# Patient Record
Sex: Female | Born: 1935 | Race: White | Hispanic: No | Smoking: Former smoker
Health system: Southern US, Community
[De-identification: ages and names within clinical notes are randomized; demographics above are authoritative.]

## PROBLEM LIST (undated history)

## (undated) DIAGNOSIS — I10 Essential (primary) hypertension: Secondary | ICD-10-CM

## (undated) HISTORY — DX: Essential (primary) hypertension: I10

## (undated) HISTORY — PX: HERNIA REPAIR: SHX51

## (undated) HISTORY — PX: APPENDECTOMY: SHX54

## (undated) HISTORY — PX: BREAST BIOPSY: SHX20

## (undated) HISTORY — PX: REPLACEMENT TOTAL KNEE BILATERAL: SUR1225

---

## 1997-11-21 ENCOUNTER — Emergency Department (HOSPITAL_COMMUNITY): Admission: EM | Admit: 1997-11-21 | Discharge: 1997-11-21 | Payer: Self-pay | Admitting: Emergency Medicine

## 1998-02-18 ENCOUNTER — Other Ambulatory Visit: Admission: RE | Admit: 1998-02-18 | Discharge: 1998-02-18 | Payer: Self-pay | Admitting: Obstetrics and Gynecology

## 1999-02-19 ENCOUNTER — Emergency Department (HOSPITAL_COMMUNITY): Admission: EM | Admit: 1999-02-19 | Discharge: 1999-02-19 | Payer: Self-pay | Admitting: Emergency Medicine

## 1999-04-18 ENCOUNTER — Other Ambulatory Visit: Admission: RE | Admit: 1999-04-18 | Discharge: 1999-04-18 | Payer: Self-pay | Admitting: Obstetrics and Gynecology

## 2000-01-11 ENCOUNTER — Encounter: Admission: RE | Admit: 2000-01-11 | Discharge: 2000-01-11 | Payer: Self-pay | Admitting: Internal Medicine

## 2000-01-11 ENCOUNTER — Encounter: Payer: Self-pay | Admitting: Internal Medicine

## 2000-07-25 ENCOUNTER — Other Ambulatory Visit: Admission: RE | Admit: 2000-07-25 | Discharge: 2000-07-25 | Payer: Self-pay | Admitting: Obstetrics and Gynecology

## 2000-08-28 ENCOUNTER — Ambulatory Visit (HOSPITAL_COMMUNITY): Admission: RE | Admit: 2000-08-28 | Discharge: 2000-08-28 | Payer: Self-pay | Admitting: Obstetrics and Gynecology

## 2000-08-28 ENCOUNTER — Encounter (INDEPENDENT_AMBULATORY_CARE_PROVIDER_SITE_OTHER): Payer: Self-pay

## 2000-10-18 ENCOUNTER — Other Ambulatory Visit: Admission: RE | Admit: 2000-10-18 | Discharge: 2000-10-18 | Payer: Self-pay | Admitting: Obstetrics and Gynecology

## 2000-10-18 ENCOUNTER — Encounter (INDEPENDENT_AMBULATORY_CARE_PROVIDER_SITE_OTHER): Payer: Self-pay | Admitting: *Deleted

## 2001-01-11 ENCOUNTER — Encounter: Admission: RE | Admit: 2001-01-11 | Discharge: 2001-01-11 | Payer: Self-pay | Admitting: Internal Medicine

## 2001-01-11 ENCOUNTER — Encounter: Payer: Self-pay | Admitting: Internal Medicine

## 2001-07-15 ENCOUNTER — Encounter: Payer: Self-pay | Admitting: Internal Medicine

## 2001-07-15 ENCOUNTER — Encounter: Admission: RE | Admit: 2001-07-15 | Discharge: 2001-07-15 | Payer: Self-pay | Admitting: Internal Medicine

## 2001-09-30 ENCOUNTER — Other Ambulatory Visit: Admission: RE | Admit: 2001-09-30 | Discharge: 2001-09-30 | Payer: Self-pay | Admitting: Obstetrics and Gynecology

## 2002-01-10 ENCOUNTER — Encounter: Payer: Self-pay | Admitting: Obstetrics and Gynecology

## 2002-01-10 ENCOUNTER — Encounter: Admission: RE | Admit: 2002-01-10 | Discharge: 2002-01-10 | Payer: Self-pay | Admitting: Obstetrics and Gynecology

## 2002-12-11 ENCOUNTER — Encounter (HOSPITAL_BASED_OUTPATIENT_CLINIC_OR_DEPARTMENT_OTHER): Payer: Self-pay | Admitting: General Surgery

## 2002-12-16 ENCOUNTER — Encounter (INDEPENDENT_AMBULATORY_CARE_PROVIDER_SITE_OTHER): Payer: Self-pay | Admitting: Specialist

## 2002-12-16 ENCOUNTER — Ambulatory Visit (HOSPITAL_COMMUNITY): Admission: RE | Admit: 2002-12-16 | Discharge: 2002-12-16 | Payer: Self-pay | Admitting: General Surgery

## 2003-01-12 ENCOUNTER — Encounter: Payer: Self-pay | Admitting: Obstetrics and Gynecology

## 2003-01-12 ENCOUNTER — Encounter: Admission: RE | Admit: 2003-01-12 | Discharge: 2003-01-12 | Payer: Self-pay | Admitting: Obstetrics and Gynecology

## 2003-10-23 ENCOUNTER — Other Ambulatory Visit: Admission: RE | Admit: 2003-10-23 | Discharge: 2003-10-23 | Payer: Self-pay | Admitting: Obstetrics and Gynecology

## 2004-01-13 ENCOUNTER — Ambulatory Visit (HOSPITAL_COMMUNITY): Admission: RE | Admit: 2004-01-13 | Discharge: 2004-01-13 | Payer: Self-pay | Admitting: Obstetrics and Gynecology

## 2004-12-29 ENCOUNTER — Other Ambulatory Visit: Admission: RE | Admit: 2004-12-29 | Discharge: 2004-12-29 | Payer: Self-pay | Admitting: Obstetrics and Gynecology

## 2005-01-19 ENCOUNTER — Ambulatory Visit (HOSPITAL_COMMUNITY): Admission: RE | Admit: 2005-01-19 | Discharge: 2005-01-19 | Payer: Self-pay | Admitting: Internal Medicine

## 2006-01-16 ENCOUNTER — Other Ambulatory Visit: Admission: RE | Admit: 2006-01-16 | Discharge: 2006-01-16 | Payer: Self-pay | Admitting: Obstetrics & Gynecology

## 2006-01-22 ENCOUNTER — Ambulatory Visit (HOSPITAL_COMMUNITY): Admission: RE | Admit: 2006-01-22 | Discharge: 2006-01-22 | Payer: Self-pay | Admitting: Internal Medicine

## 2007-01-25 ENCOUNTER — Ambulatory Visit (HOSPITAL_COMMUNITY): Admission: RE | Admit: 2007-01-25 | Discharge: 2007-01-25 | Payer: Self-pay | Admitting: Internal Medicine

## 2008-02-25 ENCOUNTER — Ambulatory Visit (HOSPITAL_COMMUNITY): Admission: RE | Admit: 2008-02-25 | Discharge: 2008-02-25 | Payer: Self-pay | Admitting: Internal Medicine

## 2008-03-02 ENCOUNTER — Inpatient Hospital Stay (HOSPITAL_COMMUNITY): Admission: RE | Admit: 2008-03-02 | Discharge: 2008-03-04 | Payer: Self-pay | Admitting: Orthopedic Surgery

## 2010-04-30 ENCOUNTER — Encounter: Payer: Self-pay | Admitting: Internal Medicine

## 2010-05-01 ENCOUNTER — Encounter: Payer: Self-pay | Admitting: Internal Medicine

## 2010-08-23 NOTE — Op Note (Signed)
[name], [name]                ACCOUNT NO.:  [id number]   MEDICAL RECORD NO.:  [id number]          PATIENT TYPE:  INP   LOCATION:  0003                         FACILITY:  [facility]   PHYSICIAN:  Madlyn Frankel. Charlann Boxer, M.D.  DATE OF BIRTH:  04/09/1918   DATE OF PROCEDURE:  03/02/2008  DATE OF DISCHARGE:                               OPERATIVE REPORT   PREOPERATIVE DIAGNOSIS:  Left knee osteoarthritis.   POSTOPERATIVE DIAGNOSIS:  Left knee osteoarthritis.   PROCEDURE:  Left total knee replacement.   COMPONENTS USED:  DePuy rotating platform posterior stabilized knee  system, size 2-1/2 femur, 2 tibia, 12.5 insert and a 32 patellar button.   SURGEON:  Madlyn Frankel. Charlann Boxer, M.D.   ASSISTANT:  Yetta Glassman. [name], PA   ANESTHESIA:  Duramorph spinal.   SPECIMENS:  None.   FINDINGS:  None.   COMPLICATIONS:  None.   TOURNIQUET TIME:  35 minutes at 250 mmHg.   DRAINS:  x1 Hemovac.   INDICATIONS FOR THE PROCEDURE:  [name] is an 77 year old female who  presented to the office for persistent recurring left knee pain that was  affecting her quality of life.  She had a history of a right total knee  replacement performed in 1992 that she had done real well from.  Given  the persistence of discomfort and her overall health, she wished to  proceed with knee replacement.  She was unable to get relief from any  other measures.  Risks and benefits were reviewed including the  postoperative course, which she remained for the past.  Her right knee  at this point remained stable with a good polyethylene insert and no  signs of excessive wear.  Consent was obtained.   PROCEDURE IN DETAIL:  The patient was brought to operative theater.  Once adequate anesthesia and preoperative antibiotics, Ancef,  administered, the patient was positioned supine with a thigh tourniquet  placed.  The left lower extremity was prescrubbed and prepped and draped  in sterile fashion.  Time-out was performed identifying  the patient and  extremity.  The leg was exsanguinated, tourniquet elevated to 250 mmHg.  The midline incision was made followed by median arthrotomy.  Following  initial debridement, attention was directed to the patella.  Precut  measurement was approximately 21 mm.  I resected down to 13 and used a  32 patellar button.  A metal shim was placed on this cut surface to  protect it from retractors as well as the saw blade.  Attention was now  directed to the femur.  The femoral canal was opened with a drill,  irrigated to prevent fat emboli, and with an intramedullary rod placed 3  degrees of valgus I resected 10 mm of bone off the distal femur.  Attention was now directed to the tibia, where the tibia was subluxated  anteriorly.  Further meniscus and cruciate stumps were debrided.  Using  the extramedullary guide I resected 10 mm of bone off of the proximal  lateral tibia.  I checked this cut surface to make sure it was  perpendicular with an alignment  rod.  I then checked with an extension  block and made certain that the knee was going to come to extension with  a 10-mm block in place.   Once this was done, the attention was now directed to the femur.  Femoral rotation was based off the proximal tibial cut.  The rotation  block was placed with a 3-degree intramedullary rod and rotation set off  the proximal cut of the tibia.   The rotation pins were placed.  I exchanged this out for the 2-1/2 femur  as we sized the femur to be this.   The anterior, posterior and chamfer cuts were made.  There was  approximately a millimeter notch made anteriorly.   The final box cut was made off the lateral aspect of the distal femur.   Attention was now redirected back to the tibia and the tibia subluxated  anteriorly.  The cut surface best was fit with a size 2 tibia.  The  tibial tray was pinned into position, drilled and keel punched and a  trial reduction then carried out.  During the trial  reduction I  determined that the 12.5 insert gave me the best stability with  extension through flexion.  The patella tracked through the trochlea  without application of pressure.   All trial components were removed.  The knee was irrigated with normal  saline solution.  We injected the synovial capsule junction with 0.25%  Marcaine with epinephrine and 1 cc of Toradol for total of 61 cc.   The cement was mixed, final components opened.   Final components were cemented into position with the knee brought into  extension with a 12.5 insert.  Extruded cement was removed.  Once the  cement had cured, excessive cement was removed throughout the knee.  The  final 12.5 insert matching the size 2-1/2 femur was then placed.  The  knee was reirrigated with normal saline solution and tourniquet was let  down at 35 minutes.  A medium Hemovac drain was placed deep.   The extensor mechanism was then reapproximated using #1 Vicryl with the  knee in flexion.  The remainder of the wound was closed in layers with a  2-0 Vicryl and staples on the skin due to the fact she had a very thin  epidermal layer.  The patient's knee was then cleaned, dried and dressed  sterilely with a sterile dressing.  She was brought to recovery room in  stable condition, tolerated the procedure well.      Madlyn Frankel Charlann Boxer, M.D.  Electronically Signed     MDO/MEDQ  D:  03/02/2008  T:  03/02/2008  Job:  [id number]

## 2010-08-23 NOTE — H&P (Signed)
NAME:  [name], [name]                ACCOUNT NO.:  [id number]   MEDICAL RECORD NO.:  [id number]          PATIENT TYPE:  OUT   LOCATION:  M[facility]O                          FACILITY:  [facility]   PHYSICIAN:  Benjamin L. [name], [place]   DATE OF BIRTH:  January 01, 1918   DATE OF ADMISSION:  DATE OF DISCHARGE:                              HISTORY & PHYSICAL   ADDENDUM   To job [id number].  Please fax a copy of this to Dr. Pearson Grippe.           ______________________________  Yetta Glassman. [place], [place]     BLM/MEDQ  D:  02/12/2008  T:  02/12/2008  Job:  [id number]

## 2010-08-23 NOTE — H&P (Signed)
[name], [name]                ACCOUNT NO.:  [id number]   MEDICAL RECORD NO.:  [id number]          PATIENT TYPE:  INP   LOCATION:                               FACILITY:  [facility]   PHYSICIAN:  Madlyn Frankel. Charlann Boxer, M.D.  DATE OF BIRTH:  07-May-1917   DATE OF ADMISSION:  03/02/2008  DATE OF DISCHARGE:                              HISTORY & PHYSICAL   PROCEDURE:  Left total knee replacement.   CHIEF COMPLAINT:  Left-knee pain.   HISTORY OF PRESENT ILLNESS:  Patient is an 77 year old female with a  history of left-knee pain, secondary to osteoarthritis.  It has been  refractory to all conservative treatment.  She does also have a history  of a right total knee replacement, back in the 90s, and has done well  with this.   PRIMARY CARE PHYSICIAN:  Massie Maroon, M.D.   PAST MEDICAL HISTORY:  Significant for:  1. Osteoarthritis.  2. Hypertension.  3. Dyslipidemia.  4. Cataracts.  5. Postmenopausal.  6. Umbilical hernia.   PAST SURGICAL HISTORY:  1. Includes tonsillectomy.  2. Appendectomy.  3. Breast biopsy in 2004.  4. Umbilical hernia in 2004.  5. Knee replacement in 1992.  6. Multiple oral surgeries for root canal.  7. Cataract surgery, 2009.   FAMILY MEDICAL HISTORY:  Stroke, cancer.   SOCIAL HISTORY:  Married.  Primary care-giver will be her husband in the  home postoperatively.   DRUG ALLERGIES:  NEOSPORIN, NEO POLY DEX, PENICILLIN, CLINORIL.   MEDICATIONS:  1. HCTZ 12.5 mg one p.o. on Tuesday, Thursday and Saturday.  2. Vagifem 25 mcg one tablet twice weekly.  3. Erythromycin 500 mg two tablets one hour before dental procedure.  4. Lisinopril 5 mg one p.o. daily.  5. Over-the-counter includes calcium 600 mg p.o. b.i.d.  6. Vitamin C daily.  7. Aspirin 81 mg daily.  8. Multivitamin daily.  9. Magnesium 160 mg p.o. b.i.d.  10.Vitamin B12 1000 mEq daily.  11.Omega 3 1000 mg two p.o. daily.  12.Celebrex 200 mg one p.o. b.i.d. times two weeks after surgery.   REVIEW  OF SYSTEMS:  GENERAL:  She has some weight change and memory  issues.  HEENT/NEUROLOGIC:  She has some hearing loss and balance  problems.  DERMATOLOGY:  Itching and hives.  GENITOURINARY:  She has  increased urinary frequency, urinating at night, incontinence issues.  MUSCULOSKELETAL:  She has muscle stiffness.  Otherwise, see HPI.   PHYSICAL EXAM:  VITAL SIGNS:  Pulse 72, respirations 16, blood pressure  122/70.  GENERAL:  Awake, alert and oriented, well-developed, well-nourished, in  no acute distress, uses a cane.  NECK:  Supple, no carotid bruits.  CHEST:  Lungs clear to auscultation bilaterally.  BREASTS:  Deferred.  HEART:  Regular rate and rhythm, S1, S2 distinct.  ABDOMEN:  Soft, nontender, nondistended.  Bowel sounds present.  GENITOURINARY:  Deferred.  EXTREMITIES:  Left knee has varus 0-120 range of motion.  SKIN:  No cellulitis.  NEUROLOGIC:  Intact distal sensibilities.   LABORATORY DATA:  Labs, EKG and chest x-ray are all pending presurgical  testing.   IMPRESSION:  Left-knee osteoarthritis.   PLAN OF ACTION:  Left total knee replacement, [facility],  March 02, 2008, by surgeon, Dr. Durene Romans.  Risks and  complications were discussed.   Postoperative medications, including Lovenox, Robaxin, iron, aspirin,  Colace, MiraLax, as well as Celebrex, provided at time of history and  physical.  Pain medicines to be dispensed at time of surgery.     ______________________________  [name], [place]      Madlyn Frankel. Charlann Boxer, M.D.  Electronically Signed    BLM/MEDQ  D:  02/12/2008  T:  02/12/2008  Job:  [id number]   cc:   Massie Maroon, MD  Fax: [phone]

## 2010-08-26 NOTE — Op Note (Signed)
[facility]  Patient:    [name], [name]                       MRN: [id number] Proc. Date: 08/28/00 Adm. Date:  [id number] Attending:  Jenean Lindau                           Operative Report  PREOPERATIVE DIAGNOSES: 1. Postmenopausal bleeding. 2. Endometrial polyps. 3. Cervical stenosis.  POSTOPERATIVE DIAGNOSES: 1. Postmenopausal bleeding. 2. Endometrial polyps. 3. Cervical stenosis. 4. No identifiable polyp noted.  OPERATION:  Hysteroscopic resection with sharp curettage.  SURGEON:  Laqueta Linden, M.D.  ANESTHESIA: MAC sedation with paracervical block.  ESTIMATED BLOOD LOSS:  Less than 50 cc.  SORBITOL NET INTAKE:  Less than 200 cc.  COMPLICATIONS:  Fundal uterine perforation with the hysteroscope at the conclusion of the procedure.  CONDITION:  Stable to the recovery room.  INDICATIONS:  [name] is an 77 year old menopausal female, not on hormone replacement therapy, who presented with postmenopausal bleeding.  She underwent a pelvic ultrasound which revealed a thickened endometrial stripe. She underwent a sonohistogram which was difficult due to cervical stenosis requiring dilation but which showed a broad based echogenic focus on the anterior uterine wall measuring 2.3 x 0.4 x 1.6 cm.  This was felt to represent a large polyp versus thickening.  The patient is therefore to undergo hysteroscopic evaluation and resection.  She has been counselled as to the risks, benefits, alternatives of the procedure including but not limited to infection, bleeding, anesthesia risk, uterine perforation, sorbitol excess with pulmonary edema and related complications, recurrent polyp, incomplete resection of the polyp, and abnormal findings necessitating additional surgery.  She has seen the informed consent film, given full consent, and presents now for same day surgery.  DESCRIPTION OF PROCEDURE: The patient was taken to the operating room,  and, after proper identification and consents were ascertained, she was placed on the operating table in supine position.  After IV sedation was accomplished, she was was placed in [place] stirrups, and the perineum and vagina were prepped and draped in routine sterile fashion.  Bimanual examination confirmed an anterior to mid plane, normal size, mobile uterus.  A transurethral Foley was placed which was removed at the conclusion of the procedure.  A speculum was placed in the vagina and a paracervical block utilizing 10 cc of 1% plain Xylocaine was then placed.   The cervix was noted to be markedly stenotic requiring initial dilation with the Hegar dilators.  Careful dilation was then accomplished due to the known risk of uterine perforation due to the patients age as well as the cervical stenosis.  The internal os was gently dilated up to a #33 Pratt dilator.  The patient was moved somewhat during this time frame but did not appear to be in any discomfort.   The resectoscope with continuous Sorbitol infusion was then inserted under direct vision.  The endocervical canal was free of lesions.  The endometrial cavity was difficult to visualize as there were multiple small blood vessels that were actively bleeding upon insertion of the scope.  It was presumed that these vessels were traumatized somewhat during the dilation, although this seemed to be a greater amount of just diffuse vasculature and bleeding than normally encountered.  Of note, there was no focal lesion noted anteriorly.  The entire endometrial surface appeared a bit irregular with what appeared to be some  calcifications present. A small posterior nodule was identified which was the only focal lesion identified.  Severely of the actively bleeding vessels were cauterized in attempt to improve visibility with no additional findings.  The posterior nodule was resected and sent as a separate specimen.  Upon reinsertion of the scope  after resection, there was some difficulty encountered with connected the inner to the outer scope, and slightly greater pressure was exerted than typical.  At that point when the scope was inserted, a fundal perforation was noted.  It was not felt that the perforation occurred at the time of resection but rather with the reinsertion of the scope after the resection, although it could have occurred with the resection.  There was bowel distantly seen through the fundus.  There was no obvious injury or artifact identified at this point.  The scope at this point was withdrawn.  Careful sharp curettage of the anterior wall of the uterus was performed with a scant amount of additional tissue obtained.  This was felt necessary due to the this being the location of the supposed lesion noted on sonohistogram.  A scant amount of additional tissue was obtained, and this was sent to pathology.  The scope was reinserted, and there were again noted these vessels that were bleeding slightly but no active bleeding from the perforation site and no other focal lesions identified.  At this point, the procedure was terminated.  All instruments were removed.  There was no active bleeding per os.  Net sorbitol intake was less than 200 cc.  The patient was awakened, was stable, and transferred to the recovery room.  The findings and perforation at the time of surgery were discussed at length with the patients family as well as the patient, and appropriate precautions were given.  She will be discharged home with her family and will be observed closely for any peritoneal signs or symptoms.  She was given extensive written and verbal discharge instructions and is to follow up with me in the office in four to six weeks.  However, she will follow up sooner for any peritoneal signs, symptoms, or any other concerns.   She was told to take Advil or Aleve as needed for any cramping. She was given 1 dose of IV antibiotics  in the recovery room prior to discharge.   She is to continue her routine medications.  The patient will be observed and discharged per anesthesia protocol with a change in this plan if  she has any excessive pain or problems subsequent to this dictation.  Of note, the possibility of overnight observation was discussed with the family who felt that the patient could be watched closely at home and would prefer to be at home as opposed to the hospital.  I feel that this is appropriate due to the unlikely possibility of a subsequent problem and responsible family. DD:  08/28/00 TD:  08/28/00 Job: [id number]

## 2010-08-26 NOTE — Op Note (Signed)
   NAME:  [name], [name]                          ACCOUNT NO.:  [id number]   MEDICAL RECORD NO.:  [id number]                   PATIENT TYPE:  OIB   LOCATION:  NA                                   FACILITY:  [facility]   PHYSICIAN:  Leonie Man, M.D.                DATE OF BIRTH:  01-Apr-1918   DATE OF PROCEDURE:  DATE OF DISCHARGE:                                 OPERATIVE REPORT   PREOPERATIVE DIAGNOSIS:  Umbilical hernia.   POSTOPERATIVE DIAGNOSIS:  Umbilical hernia.   PROCEDURE:  Repair of umbilical hernia with mesh.   SURGEON:  Leonie Man, M.D.   ASSISTANT:  Nurse.   ANESTHESIA:  General.   INDICATIONS:  Ms. Godette is an 77 year old lady who presents with  intermittently incarcerating umbilical hernia and is now brought to the  operating room for repair after the risks and potential benefits of surgery  have been fully discussed, all questions answered, and consent obtained.   DESCRIPTION OF PROCEDURE:  Following the induction of satisfactory general  anesthesia, the patient was positioned supinely and the abdomen was prepped  and draped to be included in the sterile operative field.  A circumumbilical  incision was carried down on the inferior skin of the umbilicus, down  through the skin, and down to the sac.  The sac was dissected free from off  of the overlying skin and carried down to the fascia.  The sac was then  opened, serially amputated, removed, and forwarded for pathologic  evaluation.  The edges of the hernia were freed and prepared for repair.  I  placed an adhesive preventing Seprafilm slurry into the peritoneal cavity to  prevent adhesions of the mesh to the viscera.  I then used a small mesh plug  to plug the defect and this was sewn in with interrupted sutures of 0  Novofil sutures.  The repair was noted to be intact.  The sponge,  instrument, and sharp counts were verified.  The subcutaneous tissues were  closed with interrupted 2-0 Vicryl sutures.  The  skin was closed with a  running 4-0 Monocryl suture and then reinforced with Steri-Strips.  A  sterile compressive dressing was applied.  The anesthetic was reversed.  The  patient was removed from the operating room to the recovery room in stable  condition.  She tolerated the procedure well.                                               Leonie Man, M.D.    PB/MEDQ  D:  12/16/2002  T:  12/16/2002  Job:  [id number]

## 2010-08-26 NOTE — Discharge Summary (Signed)
[name], [name]                ACCOUNT NO.:  [id number]   MEDICAL RECORD NO.:  [id number]          PATIENT TYPE:  INP   LOCATION:  1618                         [facility]ACILITY:  [facility]   PHYSICIAN:  [name], M.D.  DATE OF BIRTH:  09-05-1917   DATE OF ADMISSION:  03/02/2008  DATE OF DISCHARGE:  03/04/2008                               DISCHARGE SUMMARY   ADMITTING DIAGNOSES:  1. Osteoarthritis.  2. Hypertension.  3. Dyslipidemia.  4. Cataracts.  5. Postmenopausal.  6. Umbilical hernia.   DISCHARGE DIAGNOSIS:  1. Osteoarthritis.  2. Hypertension.  3. Dyslipidemia.  4. Cataracts.  5. Postmenopausal.  6. Umbilical hernia.   HISTORY OF PRESENT ILLNESS:  An 77 year old female history of left knee  pain secondary to osteoarthritis refractory to all conservative  treatment.   CONSULTATION:  None.   PROCEDURE:  Left total knee replacement by surgeon Dr. Durene Sawyer.  Assistant, [name].   LABORATORY DATA:  Final CBC readings hemoglobin 9.4, hematocrit 27.7,  platelets 184,000.  Metabolic panel sodium 140, potassium 3.7,  creatinine 0.62, glucose 122.   RADIOLOGY:  Chest 2-view no active cardiac pulmonary disease.   CARDIOLOGY:  Full cardiology workup by Dr. Nanetta Sawyer before  surgery.   HOSPITAL COURSE:  The patient underwent a left total knee replacement,  tolerated procedure well and admitted to the orthopedic floor.  She  remained hemodynamically and orthopedically stable throughout her course  stay.  She made adequate progress with physical therapy prior to being  discharged home.  She was able to do straight leg raising and was in  stable condition.   DISCHARGE DISPOSITION:  Discharged home with home health care PT in  stable and improved condition.   DISCHARGE DIET:  Regular.   DISCHARGE WOUND CARE:  Keep dry.   DISCHARGE MEDICATIONS:  1. Lovenox 4 mg subcu 24 times 11 days.  2. Robaxin 5 mg p.o. q.6 hours.  3. Aspirin 325 mg p.o. daily after  Lovenox.  4. Iron 325 mg t.i.d.  5. Colace 100 mg b.i.d.  6. MiraLax 17 grams daily.  7. Norco 5/325 one to two p.o. q.4 to 6 p.r.n. pain.  8. Lisinopril 5 mg daily.  9. Hydrochlorothiazide 12.5 mg on Tuesday, Thursday, Saturday.  10.Multivitamin daily.  11.Celebrex 2 mg p.o. b.i.d.   DISCHARGE FOLLOWUP:  With Dr. Charlann Sawyer.   DISCHARGE PHYSICAL THERAPY:  Weightbearing as tolerated with use of  rolling walker.     ______________________________  [name], [place]      [name], M.D.  Electronically Signed    BLM/MEDQ  D:  03/31/2008  T:  03/31/2008  Job:  [id number]   cc:   Massie Maroon, MD  Fax: [phone]   [name], M.D.  Fax: [phone]

## 2011-01-10 LAB — BASIC METABOLIC PANEL
BUN: 25 — ABNORMAL HIGH
CO2: 29
Calcium: 7.7 — ABNORMAL LOW
Creatinine, Ser: 0.54
Creatinine, Ser: 0.6
GFR calc Af Amer: >60
GFR calc Af Amer: >60
GFR calc non Af Amer: >60
GFR calc non Af Amer: >60
GFR calc non Af Amer: >60
Glucose, Bld: 122 — ABNORMAL HIGH
Glucose, Bld: 70
Potassium: 3.7
Sodium: 140
Sodium: 140

## 2011-01-10 LAB — CBC
HCT: 27.7 — ABNORMAL LOW
Hemoglobin: 10.1 — ABNORMAL LOW
Hemoglobin: 9.4 — ABNORMAL LOW
MCHC: 34
MCV: 99.8
Platelets: 283
RBC: 2.81 — ABNORMAL LOW
RBC: 3 — ABNORMAL LOW
RDW: 17 — ABNORMAL HIGH
RDW: 17.9 — ABNORMAL HIGH
WBC: 10.4
WBC: 6.5

## 2011-01-10 LAB — URINALYSIS, ROUTINE W REFLEX MICROSCOPIC
Bilirubin Urine: NEGATIVE
Glucose, UA: NEGATIVE
Hgb urine dipstick: NEGATIVE
Specific Gravity, Urine: 1.016
Urobilinogen, UA: 0.2
pH: 7.5

## 2011-01-10 LAB — APTT: aPTT: 25

## 2011-01-10 LAB — DIFFERENTIAL
Basophils Absolute: 0
Basophils Relative: 0
Eosinophils Absolute: 0.1
Eosinophils Relative: 2
Lymphocytes Relative: 16
Lymphs Abs: 1
Monocytes Absolute: 0.5
Monocytes Relative: 7
Neutro Abs: 4.9
Neutrophils Relative %: 74

## 2011-01-10 LAB — PROTIME-INR
INR: 1.1
Prothrombin Time: 14.4

## 2011-01-10 LAB — TYPE AND SCREEN
ABO/RH(D): A POS
Antibody Screen: NEGATIVE

## 2011-01-10 LAB — ABO/RH: ABO/RH(D): A POS

## 2011-04-25 ENCOUNTER — Ambulatory Visit (INDEPENDENT_AMBULATORY_CARE_PROVIDER_SITE_OTHER): Payer: Medicare Other

## 2011-04-25 DIAGNOSIS — R300 Dysuria: Secondary | ICD-10-CM | POA: Diagnosis not present

## 2011-04-25 DIAGNOSIS — N390 Urinary tract infection, site not specified: Secondary | ICD-10-CM

## 2011-09-08 DIAGNOSIS — I10 Essential (primary) hypertension: Secondary | ICD-10-CM | POA: Diagnosis not present

## 2011-09-08 DIAGNOSIS — E7800 Pure hypercholesterolemia, unspecified: Secondary | ICD-10-CM | POA: Diagnosis not present

## 2011-09-13 DIAGNOSIS — R5381 Other malaise: Secondary | ICD-10-CM | POA: Diagnosis not present

## 2011-09-13 DIAGNOSIS — I10 Essential (primary) hypertension: Secondary | ICD-10-CM | POA: Diagnosis not present

## 2011-12-20 DIAGNOSIS — H04229 Epiphora due to insufficient drainage, unspecified lacrimal gland: Secondary | ICD-10-CM | POA: Diagnosis not present

## 2012-01-15 ENCOUNTER — Ambulatory Visit (INDEPENDENT_AMBULATORY_CARE_PROVIDER_SITE_OTHER): Payer: Medicare Other | Admitting: Family Medicine

## 2012-01-15 VITALS — BP 152/76 | HR 75 | Temp 98.0°F | Resp 16 | Ht <= 58 in | Wt 143.0 lb

## 2012-01-15 DIAGNOSIS — N390 Urinary tract infection, site not specified: Secondary | ICD-10-CM

## 2012-01-15 DIAGNOSIS — IMO0001 Reserved for inherently not codable concepts without codable children: Secondary | ICD-10-CM

## 2012-01-15 DIAGNOSIS — R350 Frequency of micturition: Secondary | ICD-10-CM

## 2012-01-15 DIAGNOSIS — I10 Essential (primary) hypertension: Secondary | ICD-10-CM | POA: Insufficient documentation

## 2012-01-15 LAB — POCT UA - MICROSCOPIC ONLY
Casts, Ur, LPF, POC: NEGATIVE
Crystals, Ur, HPF, POC: NEGATIVE

## 2012-01-15 LAB — POCT URINALYSIS DIPSTICK
Ketones, UA: NEGATIVE
Protein, UA: NEGATIVE
Spec Grav, UA: 1.01
Urobilinogen, UA: 0.2
pH, UA: 7

## 2012-01-15 MED ORDER — NITROFURANTOIN MONOHYD MACRO 100 MG PO CAPS: 100.0000 mg | ORAL_CAPSULE | Freq: Two times a day (BID) | ORAL | Status: DC

## 2012-01-15 NOTE — Progress Notes (Addendum)
[facility] [address] [place] [address] [phone]  Date:  01/15/2012   Name:  [name]   DOB:  [birth year]   MRN:  [id number]  PCP:  Pearson Grippe, MD    Chief Complaint: Urinary Tract Infection   History of Present Illness:  [name] is a 78 y.o. very pleasant female patient who presents with the following:  Here today with concern about a UTI. She has noted urinary frequency and burning for about 2 days.  She will be going out of town soon and wanted to be sure she does not need medications.   No hematuria.   No fever. She has back pain but it is usual for her.  No vomiting.   No vaginal complaint.    She is generally healthy for her age- takes medication for BP  There is no problem list on file for this patient.   No past medical history on file.  No past surgical history on file.  History  Substance Use Topics  . Smoking status: Former Games developer  . Smokeless tobacco: Not on file  . Alcohol Use: Not on file    No family history on file.  Allergies  Allergen Reactions  . Amoxicillin Rash  . Penicillins Rash    Medication list has been reviewed and updated.  Current Outpatient Prescriptions on File Prior to Visit  Medication Sig Dispense Refill  . calcium carbonate (OS-CAL) 600 MG TABS Take 600 mg by mouth daily.      . hydrochlorothiazide (MICROZIDE) 12.5 MG capsule Take 12.5 mg by mouth. Pt takes 3 times a week      . lisinopril (PRINIVIL,ZESTRIL) 10 MG tablet Take 10 mg by mouth daily.        Review of Systems:  As per HPI- otherwise negative.   Physical Examination: Filed Vitals:   01/15/12 0955  BP: 152/76  Pulse: 75  Temp: 98 F (36.7 C)  Resp: 16   Filed Vitals:   01/15/12 0955  Height: 4\' 10"  (1.473 m)  Weight: 143 lb (64.864 kg)   Body mass index is 29.89 kg/(m^2). Ideal Body Weight: Weight in (lb) to have BMI = 25: 119.4   GEN: WDWN, NAD, Non-toxic, A & O x 3, overweight, appears younger than  age HEENT: Atraumatic, Normocephalic. Neck supple. No masses, No LAD.  Bilateral TM wnl, oropharynx normal.  PEERL,EOMI.   Ears and Nose: No external deformity. CV: RRR, No M/G/R. No JVD. No thrill. No extra heart sounds. PULM: CTA B, no wheezes, crackles, rhonchi. No retractions. No resp. distress. No accessory muscle use. ABD: S, NT, ND, +BS. No rebound. No HSM.  No CVA tenderness.  Does have some ache in her back but she relates this to lifting last week EXTR: No c/c/e NEURO Normal gait.  PSYCH: Normally interactive. Conversant. Not depressed or anxious appearing.  Calm demeanor.   Results for orders placed in visit on 01/15/12  POCT UA - MICROSCOPIC ONLY      Component Value Range   WBC, Ur, HPF, POC 20-30     RBC, urine, microscopic 3-6     Bacteria, U Microscopic 2+     Mucus, UA neg     Epithelial cells, urine per micros 2-4     Crystals, Ur, HPF, POC neg     Casts, Ur, LPF, POC neg     Yeast, UA neg    POCT URINALYSIS DIPSTICK      Component  Value Range   Color, UA yellow     Clarity, UA cloudy     Glucose, UA neg     Bilirubin, UA neg     Ketones, UA neg     Spec Grav, UA 1.010     Blood, UA trace     pH, UA 7.0     Protein, UA neg     Urobilinogen, UA 0.2     Nitrite, UA positive     Leukocytes, UA large (3+)      Assessment and Plan: 1. Frequency  POCT UA - Microscopic Only, POCT urinalysis dipstick  2. High blood pressure    3. UTI (lower urinary tract infection)  Urine culture, nitrofurantoin, macrocrystal-monohydrate, (MACROBID) 100 MG capsule   Will treat as above for UTI.  She is going out of town and is not sure where she can be reached, so I asked her to call me if not better within 48 hours.  Daughter Cathy's cell is [phone], we can also try this number.  Fluids, let me know if not better!    BP is under fair control today.  Continue to follow with PCP     Mikalia Fessel, MD  Received urine culture today- 01/17/12.  She has E. Coli resistant  to macrobid.  Will give a short course of cipro as she is penicillin allergic.  Called and discussed with her, will send her rx to the [facility] in [facility] that she requested.  Stop macrobid.  She feels better but not all the way well yet.

## 2012-01-15 NOTE — Patient Instructions (Addendum)
Use the nitrofurantoin for your UTI.  Please call me if you are not better in 48 hours.  Have fun at the beach!

## 2012-01-17 LAB — URINE CULTURE

## 2012-01-17 MED ORDER — CIPROFLOXACIN HCL 250 MG PO TABS: 250.0000 mg | ORAL_TABLET | Freq: Two times a day (BID) | ORAL | Status: DC

## 2012-01-17 NOTE — Addendum Note (Signed)
Addended by: Abbe Amsterdam C on: 01/17/2012 01:31 PM   Modules accepted: Orders

## 2012-03-22 DIAGNOSIS — H43819 Vitreous degeneration, unspecified eye: Secondary | ICD-10-CM | POA: Diagnosis not present

## 2012-03-22 DIAGNOSIS — H04229 Epiphora due to insufficient drainage, unspecified lacrimal gland: Secondary | ICD-10-CM | POA: Diagnosis not present

## 2012-03-22 DIAGNOSIS — H52209 Unspecified astigmatism, unspecified eye: Secondary | ICD-10-CM | POA: Diagnosis not present

## 2012-03-22 DIAGNOSIS — Z961 Presence of intraocular lens: Secondary | ICD-10-CM | POA: Diagnosis not present

## 2012-03-25 DIAGNOSIS — R5381 Other malaise: Secondary | ICD-10-CM | POA: Diagnosis not present

## 2012-03-25 DIAGNOSIS — I10 Essential (primary) hypertension: Secondary | ICD-10-CM | POA: Diagnosis not present

## 2012-03-28 DIAGNOSIS — I10 Essential (primary) hypertension: Secondary | ICD-10-CM | POA: Diagnosis not present

## 2012-03-28 DIAGNOSIS — Z23 Encounter for immunization: Secondary | ICD-10-CM | POA: Diagnosis not present

## 2012-03-28 DIAGNOSIS — D7589 Other specified diseases of blood and blood-forming organs: Secondary | ICD-10-CM | POA: Diagnosis not present

## 2012-03-28 DIAGNOSIS — E7800 Pure hypercholesterolemia, unspecified: Secondary | ICD-10-CM | POA: Diagnosis not present

## 2012-04-17 DIAGNOSIS — E7800 Pure hypercholesterolemia, unspecified: Secondary | ICD-10-CM | POA: Diagnosis not present

## 2012-04-17 DIAGNOSIS — J069 Acute upper respiratory infection, unspecified: Secondary | ICD-10-CM | POA: Diagnosis not present

## 2012-09-18 ENCOUNTER — Ambulatory Visit: Payer: Medicare Other

## 2012-09-18 ENCOUNTER — Ambulatory Visit (INDEPENDENT_AMBULATORY_CARE_PROVIDER_SITE_OTHER): Payer: Medicare Other | Admitting: Family Medicine

## 2012-09-18 VITALS — BP 134/72 | HR 80 | Temp 98.6°F | Resp 18 | Wt 140.0 lb

## 2012-09-18 DIAGNOSIS — M25559 Pain in unspecified hip: Secondary | ICD-10-CM

## 2012-09-18 DIAGNOSIS — T148XXA Other injury of unspecified body region, initial encounter: Secondary | ICD-10-CM | POA: Diagnosis not present

## 2012-09-18 DIAGNOSIS — M25551 Pain in right hip: Secondary | ICD-10-CM

## 2012-09-18 NOTE — Patient Instructions (Addendum)
We will call you to schedule the MRI of your hip.  Avoid weightbearing if painful.  Avoid pressure to affected area. Heat or ice to help with swelling. Tylenol if needed for pain - call if stronger medicine needed. Return to the clinic or go to the nearest emergency room if any of your symptoms worsen or new symptoms occur.

## 2012-09-18 NOTE — Progress Notes (Signed)
  Subjective:    Patient ID: [name], female    DOB: May 04, 1917, 79 y.o.   MRN: [id number]  HPI [name] is a 79 y.o. female Larey Seat about 9 days ago - fell onto side of R hip.  Noticed bruise few days later, bruise seems to be getting bigger, and more sore in that area. Unable to get comfortable last night. Progressive soreness past few days. No hx of hip or other fractures. No known hx of osteoporosis - has DEXA scheduled this Monday at [facility] with Dr. Pearson Grippe. Pain with walking last 2 days, but walking ok prior - walking up to a mile per day past week.   OrthoCharlann Boxer  Tx: none.    Review of Systems  Gastrointestinal: Negative for abdominal pain.  Genitourinary: Negative for difficulty urinating.  Musculoskeletal: Positive for arthralgias (outside R hip. ). Negative for back pain (no new back pain since fall. ).  Skin: Positive for color change.  Neurological: Negative for weakness.       Objective:   Physical Exam  Vitals reviewed. Constitutional: She is oriented to person, place, and time. She appears well-developed and well-nourished.  Pulmonary/Chest: Effort normal and breath sounds normal.  Musculoskeletal:       Right hip: She exhibits tenderness and bony tenderness. She exhibits normal range of motion and normal strength.       Legs: Neurological: She is alert and oriented to person, place, and time.   NVI distally.   [facility] reading (PRIMARY) by  Dr. Neva Seat: R hip - DJD, without apparent fx.  Soft tissue swelling lateral to trochanter.   Radiology report:   IMPRESSION:  1. Degenerative changes of the hips and the lower lumbar spine.  2. No acute abnormality.      Assessment & Plan:  [name] is a 79 y.o. female Right hip pain - Plan: DG Hip Complete Right, MR Hip Right Wo Contrast  Bruise - Plan: DG Hip Complete Right, MR Hip Right Wo Contrast  R hip lateral contusion with large bruise, doubt fracture, but with increased pain - will check MRI of R  hip next 2 days.  DDX large seroma/ecchymosis or early heterotopic ossification. Tylenol otc prn, call if stronger med needed. Heat or ice to affected area, recheck in next 2 days if not improving, depending on MRI.   Patient Instructions  We will call you to schedule the MRI of your hip.  Avoid weightbearing if painful.  Avoid pressure to affected area. Heat or ice to help with swelling. Tylenol if needed for pain - call if stronger medicine needed. Return to the clinic or go to the nearest emergency room if any of your symptoms worsen or new symptoms occur.

## 2012-09-23 DIAGNOSIS — R5381 Other malaise: Secondary | ICD-10-CM | POA: Diagnosis not present

## 2012-09-23 DIAGNOSIS — R5383 Other fatigue: Secondary | ICD-10-CM | POA: Diagnosis not present

## 2012-09-23 DIAGNOSIS — M810 Age-related osteoporosis without current pathological fracture: Secondary | ICD-10-CM | POA: Diagnosis not present

## 2012-09-23 DIAGNOSIS — D7589 Other specified diseases of blood and blood-forming organs: Secondary | ICD-10-CM | POA: Diagnosis not present

## 2012-09-25 DIAGNOSIS — E538 Deficiency of other specified B group vitamins: Secondary | ICD-10-CM | POA: Diagnosis not present

## 2012-09-26 DIAGNOSIS — D7589 Other specified diseases of blood and blood-forming organs: Secondary | ICD-10-CM | POA: Diagnosis not present

## 2012-09-26 DIAGNOSIS — I10 Essential (primary) hypertension: Secondary | ICD-10-CM | POA: Diagnosis not present

## 2012-09-26 DIAGNOSIS — M810 Age-related osteoporosis without current pathological fracture: Secondary | ICD-10-CM | POA: Diagnosis not present

## 2012-09-26 DIAGNOSIS — E7800 Pure hypercholesterolemia, unspecified: Secondary | ICD-10-CM | POA: Diagnosis not present

## 2013-01-17 DIAGNOSIS — Z23 Encounter for immunization: Secondary | ICD-10-CM | POA: Diagnosis not present

## 2013-03-24 DIAGNOSIS — I10 Essential (primary) hypertension: Secondary | ICD-10-CM | POA: Diagnosis not present

## 2013-03-24 DIAGNOSIS — Z961 Presence of intraocular lens: Secondary | ICD-10-CM | POA: Diagnosis not present

## 2013-03-24 DIAGNOSIS — H04229 Epiphora due to insufficient drainage, unspecified lacrimal gland: Secondary | ICD-10-CM | POA: Diagnosis not present

## 2013-03-24 DIAGNOSIS — D7589 Other specified diseases of blood and blood-forming organs: Secondary | ICD-10-CM | POA: Diagnosis not present

## 2013-03-24 DIAGNOSIS — H43819 Vitreous degeneration, unspecified eye: Secondary | ICD-10-CM | POA: Diagnosis not present

## 2013-03-24 DIAGNOSIS — H52209 Unspecified astigmatism, unspecified eye: Secondary | ICD-10-CM | POA: Diagnosis not present

## 2013-03-24 DIAGNOSIS — R5381 Other malaise: Secondary | ICD-10-CM | POA: Diagnosis not present

## 2013-03-24 DIAGNOSIS — M810 Age-related osteoporosis without current pathological fracture: Secondary | ICD-10-CM | POA: Diagnosis not present

## 2013-03-27 DIAGNOSIS — E7800 Pure hypercholesterolemia, unspecified: Secondary | ICD-10-CM | POA: Diagnosis not present

## 2013-03-27 DIAGNOSIS — I10 Essential (primary) hypertension: Secondary | ICD-10-CM | POA: Diagnosis not present

## 2013-03-27 DIAGNOSIS — M810 Age-related osteoporosis without current pathological fracture: Secondary | ICD-10-CM | POA: Diagnosis not present

## 2013-03-27 DIAGNOSIS — D7589 Other specified diseases of blood and blood-forming organs: Secondary | ICD-10-CM | POA: Diagnosis not present

## 2013-05-01 DIAGNOSIS — M25569 Pain in unspecified knee: Secondary | ICD-10-CM | POA: Diagnosis not present

## 2013-07-31 DIAGNOSIS — M5030 Other cervical disc degeneration, unspecified cervical region: Secondary | ICD-10-CM | POA: Diagnosis not present

## 2013-07-31 DIAGNOSIS — M542 Cervicalgia: Secondary | ICD-10-CM | POA: Diagnosis not present

## 2013-07-31 DIAGNOSIS — M47812 Spondylosis without myelopathy or radiculopathy, cervical region: Secondary | ICD-10-CM | POA: Diagnosis not present

## 2013-09-22 DIAGNOSIS — I10 Essential (primary) hypertension: Secondary | ICD-10-CM | POA: Diagnosis not present

## 2013-09-22 DIAGNOSIS — M810 Age-related osteoporosis without current pathological fracture: Secondary | ICD-10-CM | POA: Diagnosis not present

## 2013-09-22 DIAGNOSIS — N390 Urinary tract infection, site not specified: Secondary | ICD-10-CM | POA: Diagnosis not present

## 2013-09-25 DIAGNOSIS — E7800 Pure hypercholesterolemia, unspecified: Secondary | ICD-10-CM | POA: Diagnosis not present

## 2013-09-25 DIAGNOSIS — M810 Age-related osteoporosis without current pathological fracture: Secondary | ICD-10-CM | POA: Diagnosis not present

## 2013-09-25 DIAGNOSIS — I10 Essential (primary) hypertension: Secondary | ICD-10-CM | POA: Diagnosis not present

## 2013-09-29 ENCOUNTER — Other Ambulatory Visit: Payer: Self-pay | Admitting: Internal Medicine

## 2013-09-29 DIAGNOSIS — R519 Headache, unspecified: Secondary | ICD-10-CM

## 2013-09-29 DIAGNOSIS — R51 Headache: Principal | ICD-10-CM

## 2013-09-30 ENCOUNTER — Ambulatory Visit
Admission: RE | Admit: 2013-09-30 | Discharge: 2013-09-30 | Disposition: A | Discharge status: Home / Self Care | Payer: Medicare Other | Source: Ambulatory Visit | Attending: Internal Medicine | Admitting: Internal Medicine

## 2013-09-30 DIAGNOSIS — R42 Dizziness and giddiness: Secondary | ICD-10-CM | POA: Diagnosis not present

## 2013-09-30 DIAGNOSIS — R519 Headache, unspecified: Secondary | ICD-10-CM

## 2013-09-30 DIAGNOSIS — R51 Headache: Secondary | ICD-10-CM | POA: Diagnosis not present

## 2013-10-13 DIAGNOSIS — E7800 Pure hypercholesterolemia, unspecified: Secondary | ICD-10-CM | POA: Diagnosis not present

## 2013-10-13 DIAGNOSIS — R51 Headache: Secondary | ICD-10-CM | POA: Diagnosis not present

## 2013-10-13 DIAGNOSIS — I10 Essential (primary) hypertension: Secondary | ICD-10-CM | POA: Diagnosis not present

## 2013-11-28 ENCOUNTER — Ambulatory Visit (INDEPENDENT_AMBULATORY_CARE_PROVIDER_SITE_OTHER): Payer: Medicare Other | Admitting: Family Medicine

## 2013-11-28 VITALS — BP 118/76 | HR 83 | Temp 98.0°F | Resp 16 | Ht <= 58 in | Wt 141.8 lb

## 2013-11-28 DIAGNOSIS — R300 Dysuria: Secondary | ICD-10-CM

## 2013-11-28 DIAGNOSIS — N390 Urinary tract infection, site not specified: Secondary | ICD-10-CM

## 2013-11-28 LAB — POCT UA - MICROSCOPIC ONLY
Casts, Ur, LPF, POC: NEGATIVE
Crystals, Ur, HPF, POC: NEGATIVE
Mucus, UA: NEGATIVE
RBC, urine, microscopic: NEGATIVE
Yeast, UA: NEGATIVE

## 2013-11-28 LAB — POCT URINALYSIS DIPSTICK
Bilirubin, UA: NEGATIVE
Glucose, UA: NEGATIVE
Ketones, UA: NEGATIVE
Nitrite, UA: NEGATIVE
Protein, UA: NEGATIVE
Spec Grav, UA: 1.015
Urobilinogen, UA: 0.2
pH, UA: 6.5

## 2013-11-28 MED ORDER — CIPROFLOXACIN HCL 250 MG PO TABS: 250.0000 mg | ORAL_TABLET | Freq: Two times a day (BID) | ORAL | Status: DC

## 2013-11-28 NOTE — Progress Notes (Signed)
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 She comes in with 3-4 days of UTI symptoms including burning and cloudy urine.  She's had past urinary infections, most recent of which was one year ago. She's wondering why she gets urinary infections in the summer.  Patient's had no vomiting, flank pain, fever  Objective: Patient in no acute distress Results for orders placed in visit on 11/28/13  POCT URINALYSIS DIPSTICK      Result Value Ref Range   Color, UA yellow     Clarity, UA cloudy     Glucose, UA neg     Bilirubin, UA neg     Ketones, UA neg     Spec Grav, UA 1.015     Blood, UA trace-lysed     pH, UA 6.5     Protein, UA neg     Urobilinogen, UA 0.2     Nitrite, UA neg     Leukocytes, UA large (3+)    POCT UA - MICROSCOPIC ONLY      Result Value Ref Range   WBC, Ur, HPF, POC tntc     RBC, urine, microscopic neg     Bacteria, U Microscopic 1+     Mucus, UA neg     Epithelial cells, urine per micros 10-12     Crystals, Ur, HPF, POC neg     Casts, Ur, LPF, POC neg     Yeast, UA neg     Dysuria - Plan: POCT urinalysis dipstick, POCT UA - Microscopic Only, Urine culture, ciprofloxacin (CIPRO) 250 MG tablet  Urinary tract infection, site not specified  Signed, Robyn Haber, MD

## 2013-12-01 LAB — URINE CULTURE: Colony Count: >=100000

## 2014-01-11 DIAGNOSIS — Z23 Encounter for immunization: Secondary | ICD-10-CM | POA: Diagnosis not present

## 2014-02-17 ENCOUNTER — Other Ambulatory Visit: Payer: Self-pay | Admitting: Internal Medicine

## 2014-02-17 DIAGNOSIS — Z111 Encounter for screening for respiratory tuberculosis: Secondary | ICD-10-CM | POA: Diagnosis not present

## 2014-02-17 DIAGNOSIS — N939 Abnormal uterine and vaginal bleeding, unspecified: Secondary | ICD-10-CM | POA: Diagnosis not present

## 2014-02-17 DIAGNOSIS — R51 Headache: Secondary | ICD-10-CM | POA: Diagnosis not present

## 2014-02-17 DIAGNOSIS — R0600 Dyspnea, unspecified: Secondary | ICD-10-CM | POA: Diagnosis not present

## 2014-02-17 DIAGNOSIS — R079 Chest pain, unspecified: Secondary | ICD-10-CM | POA: Diagnosis not present

## 2014-02-19 ENCOUNTER — Ambulatory Visit
Admission: RE | Admit: 2014-02-19 | Discharge: 2014-02-19 | Disposition: A | Discharge status: Home / Self Care | Payer: Medicare Other | Source: Ambulatory Visit | Attending: Internal Medicine | Admitting: Internal Medicine

## 2014-02-19 DIAGNOSIS — N939 Abnormal uterine and vaginal bleeding, unspecified: Secondary | ICD-10-CM | POA: Diagnosis not present

## 2014-02-19 DIAGNOSIS — N390 Urinary tract infection, site not specified: Secondary | ICD-10-CM | POA: Diagnosis not present

## 2014-02-23 DIAGNOSIS — R87610 Atypical squamous cells of undetermined significance on cytologic smear of cervix (ASC-US): Secondary | ICD-10-CM | POA: Diagnosis not present

## 2014-02-23 DIAGNOSIS — N950 Postmenopausal bleeding: Secondary | ICD-10-CM | POA: Diagnosis not present

## 2014-03-09 DIAGNOSIS — M810 Age-related osteoporosis without current pathological fracture: Secondary | ICD-10-CM | POA: Diagnosis not present

## 2014-03-09 DIAGNOSIS — R51 Headache: Secondary | ICD-10-CM | POA: Diagnosis not present

## 2014-03-09 DIAGNOSIS — E780 Pure hypercholesterolemia: Secondary | ICD-10-CM | POA: Diagnosis not present

## 2014-03-09 DIAGNOSIS — R229 Localized swelling, mass and lump, unspecified: Secondary | ICD-10-CM | POA: Diagnosis not present

## 2014-03-13 DIAGNOSIS — N882 Stricture and stenosis of cervix uteri: Secondary | ICD-10-CM | POA: Diagnosis not present

## 2014-03-13 DIAGNOSIS — N950 Postmenopausal bleeding: Secondary | ICD-10-CM | POA: Diagnosis not present

## 2014-03-26 ENCOUNTER — Other Ambulatory Visit (HOSPITAL_COMMUNITY): Payer: Self-pay | Admitting: Respiratory Therapy

## 2014-03-26 DIAGNOSIS — R0600 Dyspnea, unspecified: Secondary | ICD-10-CM | POA: Diagnosis not present

## 2014-03-26 DIAGNOSIS — E780 Pure hypercholesterolemia: Secondary | ICD-10-CM | POA: Diagnosis not present

## 2014-03-26 DIAGNOSIS — R079 Chest pain, unspecified: Secondary | ICD-10-CM | POA: Diagnosis not present

## 2014-03-26 DIAGNOSIS — M810 Age-related osteoporosis without current pathological fracture: Secondary | ICD-10-CM | POA: Diagnosis not present

## 2014-03-27 DIAGNOSIS — H26493 Other secondary cataract, bilateral: Secondary | ICD-10-CM | POA: Diagnosis not present

## 2014-03-27 DIAGNOSIS — Z961 Presence of intraocular lens: Secondary | ICD-10-CM | POA: Diagnosis not present

## 2014-03-27 DIAGNOSIS — H43813 Vitreous degeneration, bilateral: Secondary | ICD-10-CM | POA: Diagnosis not present

## 2014-04-09 ENCOUNTER — Ambulatory Visit (HOSPITAL_COMMUNITY)
Admission: RE | Admit: 2014-04-09 | Discharge: 2014-04-09 | Disposition: A | Discharge status: Home / Self Care | Payer: Medicare Other | Source: Ambulatory Visit | Attending: Internal Medicine | Admitting: Internal Medicine

## 2014-04-09 DIAGNOSIS — R0600 Dyspnea, unspecified: Secondary | ICD-10-CM | POA: Diagnosis not present

## 2014-04-09 MED ORDER — ALBUTEROL SULFATE (2.5 MG/3ML) 0.083% IN NEBU
2.5000 mg | INHALATION_SOLUTION | Freq: Once | RESPIRATORY_TRACT | Status: AC
  Administered 2014-04-09: 2.5 mg via RESPIRATORY_TRACT

## 2014-04-14 LAB — PULMONARY FUNCTION TEST
DL/VA % pred: 97 %
DL/VA: 3.63 ml/min/mmHg/L
DLCO unc % pred: 62 %
DLCO unc: 9.23 ml/min/mmHg
FEF 25-75 Post: 1.55 L/sec
FEF 25-75 Pre: 1.56 L/sec
FEF2575-%CHANGE-POST: 0 %
FEF2575-%PRED-POST: 596 %
FEF2575-%Pred-Pre: 598 %
FEV1-%CHANGE-POST: 0 %
FEV1-%PRED-POST: 161 %
FEV1-%Pred-Pre: 161 %
FEV1-Post: 1.26 L
FEV1-Pre: 1.26 L
FEV1FVC-%Change-Post: 1 %
FEV1FVC-%PRED-PRE: 119 %
FEV6-%Change-Post: 0 %
FEV6-%PRED-POST: 145 %
FEV6-%Pred-Pre: 145 %
FEV6-POST: 1.46 L
FEV6-PRE: 1.46 L
FEV6FVC-%Change-Post: 1 %
FEV6FVC-%PRED-PRE: 110 %
FEV6FVC-%Pred-Post: 112 %
FVC-%Change-Post: -1 %
FVC-%PRED-PRE: 132 %
FVC-%Pred-Post: 130 %
FVC-POST: 1.46 L
FVC-Pre: 1.48 L
POST FEV1/FVC RATIO: 86 %
POST FEV6/FVC RATIO: 100 %
PRE FEV6/FVC RATIO: 98 %
Pre FEV1/FVC ratio: 85 %
RV % PRED: 99 %
RV: 2.33 L
TLC % PRED: 96 %
TLC: 3.86 L

## 2014-04-15 DIAGNOSIS — R0600 Dyspnea, unspecified: Secondary | ICD-10-CM | POA: Diagnosis not present

## 2014-04-17 DIAGNOSIS — I209 Angina pectoris, unspecified: Secondary | ICD-10-CM | POA: Diagnosis not present

## 2014-04-17 DIAGNOSIS — I10 Essential (primary) hypertension: Secondary | ICD-10-CM | POA: Diagnosis not present

## 2014-04-17 DIAGNOSIS — R0602 Shortness of breath: Secondary | ICD-10-CM | POA: Diagnosis not present

## 2014-05-18 DIAGNOSIS — I209 Angina pectoris, unspecified: Secondary | ICD-10-CM | POA: Diagnosis not present

## 2014-05-18 DIAGNOSIS — I10 Essential (primary) hypertension: Secondary | ICD-10-CM | POA: Diagnosis not present

## 2014-05-18 DIAGNOSIS — R0602 Shortness of breath: Secondary | ICD-10-CM | POA: Diagnosis not present

## 2014-05-18 DIAGNOSIS — J45990 Exercise induced bronchospasm: Secondary | ICD-10-CM | POA: Diagnosis not present

## 2014-05-22 ENCOUNTER — Ambulatory Visit (INDEPENDENT_AMBULATORY_CARE_PROVIDER_SITE_OTHER): Payer: Medicare Other | Admitting: Family Medicine

## 2014-05-22 VITALS — BP 140/90 | HR 83 | Temp 97.5°F | Resp 16 | Ht 70.5 in | Wt 146.0 lb

## 2014-05-22 DIAGNOSIS — R8290 Unspecified abnormal findings in urine: Secondary | ICD-10-CM

## 2014-05-22 DIAGNOSIS — R8299 Other abnormal findings in urine: Secondary | ICD-10-CM | POA: Diagnosis not present

## 2014-05-22 DIAGNOSIS — Z8742 Personal history of other diseases of the female genital tract: Secondary | ICD-10-CM | POA: Diagnosis not present

## 2014-05-22 DIAGNOSIS — N3090 Cystitis, unspecified without hematuria: Secondary | ICD-10-CM | POA: Diagnosis not present

## 2014-05-22 DIAGNOSIS — R300 Dysuria: Secondary | ICD-10-CM | POA: Diagnosis not present

## 2014-05-22 LAB — POCT URINALYSIS DIPSTICK
Bilirubin, UA: NEGATIVE
Glucose, UA: NEGATIVE
Ketones, UA: 40
Nitrite, UA: POSITIVE
PROTEIN UA: 100
Spec Grav, UA: 1.02
UROBILINOGEN UA: 0.2
pH, UA: 6

## 2014-05-22 LAB — POCT UA - MICROSCOPIC ONLY
CRYSTALS, UR, HPF, POC: NEGATIVE
Casts, Ur, LPF, POC: NEGATIVE
MUCUS UA: NEGATIVE
YEAST UA: NEGATIVE

## 2014-05-22 MED ORDER — SULFAMETHOXAZOLE-TRIMETHOPRIM 800-160 MG PO TABS: 1.0000 | ORAL_TABLET | Freq: Two times a day (BID) | ORAL | Status: DC

## 2014-05-22 NOTE — Patient Instructions (Addendum)
Drink plenty of fluids  Take soft with oxygen all one twice daily  Return in about 3 or 4 days after completion of the antibiotic course for a follow-up urine check. Please bring in a fresh specimen at that time for a lab only visit.  Discontinue the antibiotic if you develop nausea or vomiting or rashes

## 2014-05-22 NOTE — Addendum Note (Signed)
Addended by: Tommas Olp B on: 05/22/2014 02:20 PM   Modules accepted: Orders

## 2014-05-22 NOTE — Addendum Note (Signed)
Addended by: Tommas Olp B on: 05/22/2014 02:19 PM   Modules accepted: Orders

## 2014-05-22 NOTE — Progress Notes (Signed)
Subjective: 81 year old lady who has a history of a prolapsed uterus. She has had urinary tract infections periodically. Her last one was last summer, and about 6 months before that. She generally does very well. She is married and lives with her husband independently still. She is ambulatory with a cane. She has been having about 4 days of dysuria. No fever or chills.  In reviewing the old records it appears that she had some resistance when she grew a Klebsiella 2 times ago, and last time she grew a staph epidermidis.  Objective: Pleasant alert lady in no major distress. Abdomen soft and nontender. No CVA tenderness. Chest clear and heart regular.  Urine appears cloudy. She brought a specimen from home because she has a difficult time peeing in the container here.  Assessment: Urinary tract infection   Plan: Urinalysis and culture  Will expect her to come back in a couple of weeks for a test of cure, does not need to see a physician at that time.  Results for orders placed or performed in visit on 05/22/14  POCT urinalysis dipstick  Result Value Ref Range   Color, UA yellow    Clarity, UA cloudy    Glucose, UA neg    Bilirubin, UA neg    Ketones, UA 40    Spec Grav, UA 1.020    Blood, UA moderate    pH, UA 6.0    Protein, UA 100    Urobilinogen, UA 0.2    Nitrite, UA positive    Leukocytes, UA large (3+)   POCT UA - Microscopic Only  Result Value Ref Range   WBC, Ur, HPF, POC tntc    RBC, urine, microscopic 0-2    Bacteria, U Microscopic 2+    Mucus, UA neg    Epithelial cells, urine per micros 0-2    Crystals, Ur, HPF, POC neg    Casts, Ur, LPF, POC neg    Yeast, UA neg

## 2014-05-25 LAB — URINE CULTURE: Colony Count: >=100000

## 2014-05-29 ENCOUNTER — Other Ambulatory Visit (INDEPENDENT_AMBULATORY_CARE_PROVIDER_SITE_OTHER): Payer: Medicare Other

## 2014-05-29 DIAGNOSIS — R300 Dysuria: Secondary | ICD-10-CM | POA: Diagnosis not present

## 2014-05-29 DIAGNOSIS — R8299 Other abnormal findings in urine: Secondary | ICD-10-CM

## 2014-05-29 DIAGNOSIS — N3090 Cystitis, unspecified without hematuria: Secondary | ICD-10-CM

## 2014-05-29 DIAGNOSIS — Z8742 Personal history of other diseases of the female genital tract: Secondary | ICD-10-CM | POA: Diagnosis not present

## 2014-05-29 DIAGNOSIS — R8290 Unspecified abnormal findings in urine: Secondary | ICD-10-CM

## 2014-05-29 LAB — POCT URINALYSIS DIPSTICK
Bilirubin, UA: NEGATIVE
Blood, UA: NEGATIVE
Glucose, UA: NEGATIVE
KETONES UA: NEGATIVE
Nitrite, UA: NEGATIVE
Protein, UA: NEGATIVE
Spec Grav, UA: 1.02
Urobilinogen, UA: 0.2
pH, UA: 6

## 2014-05-29 LAB — POCT UA - MICROSCOPIC ONLY
CRYSTALS, UR, HPF, POC: NEGATIVE
Casts, Ur, LPF, POC: NEGATIVE
EPITHELIAL CELLS, URINE PER MICROSCOPY: NEGATIVE
Mucus, UA: NEGATIVE
YEAST UA: NEGATIVE

## 2014-06-01 ENCOUNTER — Telehealth: Payer: Self-pay

## 2014-06-01 NOTE — Telephone Encounter (Signed)
Although I cannot locate it, I am sure I have replied to this in a different note. Please look for a note where I prescribed some Keflex

## 2014-06-01 NOTE — Telephone Encounter (Signed)
Dr Linna Darner, pt is calling about urine specimen from Fri. Please review. Thanks

## 2014-06-02 MED ORDER — CEPHALEXIN 500 MG PO CAPS: 500.0000 mg | ORAL_CAPSULE | Freq: Two times a day (BID) | ORAL | Status: DC

## 2014-06-02 NOTE — Telephone Encounter (Signed)
[name] at [facility], called the report that pt has an allergy to penicillins and she wanted to double check to make sure Dr Linna Darner wants to Rx Keflex, or if he would want to Rx a different Abx. Please advise.

## 2014-06-02 NOTE — Addendum Note (Signed)
Addended by: Constance Goltz on: 06/02/2014 09:17 AM   Modules accepted: Orders

## 2014-06-02 NOTE — Telephone Encounter (Signed)
See labs. Pt notified.

## 2014-06-03 ENCOUNTER — Telehealth: Payer: Self-pay | Admitting: *Deleted

## 2014-06-03 NOTE — Telephone Encounter (Signed)
Received call from pharm on yesterday 06/02/14 regarding concerns for pt allergies and with Keflex Dr. Linna Darner gave.  Dr. Linna Darner stated he was aware of pt allergies and go to proceed with the medication order.

## 2014-06-06 NOTE — Telephone Encounter (Signed)
I believe this was taken care of with another call.

## 2014-07-08 DIAGNOSIS — E780 Pure hypercholesterolemia: Secondary | ICD-10-CM | POA: Diagnosis not present

## 2014-07-08 DIAGNOSIS — I10 Essential (primary) hypertension: Secondary | ICD-10-CM | POA: Diagnosis not present

## 2014-07-08 DIAGNOSIS — M810 Age-related osteoporosis without current pathological fracture: Secondary | ICD-10-CM | POA: Diagnosis not present

## 2014-07-13 DIAGNOSIS — N390 Urinary tract infection, site not specified: Secondary | ICD-10-CM | POA: Diagnosis not present

## 2014-07-13 DIAGNOSIS — I10 Essential (primary) hypertension: Secondary | ICD-10-CM | POA: Diagnosis not present

## 2014-07-13 DIAGNOSIS — N3090 Cystitis, unspecified without hematuria: Secondary | ICD-10-CM | POA: Diagnosis not present

## 2014-07-13 DIAGNOSIS — E780 Pure hypercholesterolemia: Secondary | ICD-10-CM | POA: Diagnosis not present

## 2014-08-10 DIAGNOSIS — I10 Essential (primary) hypertension: Secondary | ICD-10-CM | POA: Diagnosis not present

## 2014-08-10 DIAGNOSIS — M810 Age-related osteoporosis without current pathological fracture: Secondary | ICD-10-CM | POA: Diagnosis not present

## 2014-08-10 DIAGNOSIS — E780 Pure hypercholesterolemia: Secondary | ICD-10-CM | POA: Diagnosis not present

## 2014-10-14 NOTE — Telephone Encounter (Signed)
Error

## 2014-12-19 ENCOUNTER — Ambulatory Visit (INDEPENDENT_AMBULATORY_CARE_PROVIDER_SITE_OTHER): Payer: Medicare Other | Admitting: Family Medicine

## 2014-12-19 VITALS — BP 142/82 | HR 88 | Temp 98.0°F | Resp 16 | Ht 62.0 in | Wt 133.0 lb

## 2014-12-19 DIAGNOSIS — M1990 Unspecified osteoarthritis, unspecified site: Secondary | ICD-10-CM | POA: Diagnosis not present

## 2014-12-19 DIAGNOSIS — S51001A Unspecified open wound of right elbow, initial encounter: Secondary | ICD-10-CM | POA: Diagnosis not present

## 2014-12-19 DIAGNOSIS — S41111A Laceration without foreign body of right upper arm, initial encounter: Secondary | ICD-10-CM | POA: Diagnosis not present

## 2014-12-19 DIAGNOSIS — IMO0002 Reserved for concepts with insufficient information to code with codable children: Secondary | ICD-10-CM

## 2014-12-19 NOTE — Progress Notes (Addendum)
@[facility]GO @  This chart was scribed for Robyn Haber, MD by Thea Alken, ED Scribe. This patient was seen in room 6 and the patient's care was started at 3:40 PM.  Patient ID: [name] MRN: [id number], DOB: 1917-04-13, 81 y.o. Date of Encounter: 12/19/2014, 4:01 PM  Primary Physician: Jani Gravel, MD  Chief Complaint:  Chief Complaint  Patient presents with  . cut on right arm    x 3 days/ Pt. fell    HPI: 81 y.o. year old female with history below presents with a laceration to right arm that occurred 2 days ago. Pt states she bent forward to pick something off the floor when she fell forwards and cut her right arm on an unknown objecto, just beneath her elbow. Pt states wound has been actively bleeding intermittently for the past 2 days but bleeding is controlled at this time. She has applied gauze to wound and was advised to come in to be seen by her daughter. Pt is UTD on TDAP.  Past Medical History  Diagnosis Date  . Hypertension      Home Meds: Prior to Admission medications   Medication Sig Start Date End Date Taking? Authorizing Provider  aspirin 81 MG tablet Take 81 mg by mouth daily.   Yes Historical Provider, MD  calcium carbonate (OS-CAL) 600 MG TABS Take 600 mg by mouth daily.   Yes Historical Provider, MD  fish oil-omega-3 fatty acids 1000 MG capsule Take 1 g by mouth daily.   Yes Historical Provider, MD  ibuprofen (ADVIL,MOTRIN) 200 MG tablet Take 200 mg by mouth every 6 (six) hours as needed.   Yes Historical Provider, MD  magnesium citrate SOLN Take 1 Bottle by mouth once.   Yes Historical Provider, MD  Probiotic Product (PROBIOTIC DAILY PO) Take by mouth.   Yes Historical Provider, MD  Thiamine HCl (VITAMIN B-1) 250 MG tablet Take 250 mg by mouth daily.   Yes Historical Provider, MD  vitamin C (ASCORBIC ACID) 500 MG tablet Take 500 mg by mouth daily.   Yes Historical Provider, MD  albuterol (PROVENTIL HFA;VENTOLIN HFA) 108 (90 BASE) MCG/ACT inhaler Inhale into  the lungs every 6 (six) hours as needed for wheezing or shortness of breath.    Historical Provider, MD  cephALEXin (KEFLEX) 500 MG capsule Take 1 capsule (500 mg total) by mouth 2 (two) times daily. Patient not taking: Reported on 12/19/2014 06/02/14   Posey Boyer, MD  clindamycin (CLEOCIN) 150 MG capsule Take by mouth 3 (three) times daily.    Historical Provider, MD  hydrochlorothiazide (MICROZIDE) 12.5 MG capsule Take 12.5 mg by mouth. Pt takes 3 times a week    Historical Provider, MD  lisinopril (PRINIVIL,ZESTRIL) 10 MG tablet Take 10 mg by mouth daily.    Historical Provider, MD  metoprolol succinate (TOPROL-XL) 25 MG 24 hr tablet Take 25 mg by mouth daily.    Historical Provider, MD  nitroGLYCERIN (NITROSTAT) 0.4 MG SL tablet Place 0.4 mg under the tongue every 5 (five) minutes as needed for chest pain.    Historical Provider, MD  simvastatin (ZOCOR) 20 MG tablet Take 20 mg by mouth daily.    Historical Provider, MD  sulfamethoxazole-trimethoprim (BACTRIM DS,SEPTRA DS) 800-160 MG per tablet Take 1 tablet by mouth 2 (two) times daily. Patient not taking: Reported on 12/19/2014 05/22/14   Posey Boyer, MD    Allergies:  Allergies  Allergen Reactions  . Amoxicillin Rash  . Penicillins Rash    Social History  Social History  . Marital Status: Married    Spouse Name: N/A  . Number of Children: N/A  . Years of Education: N/A   Occupational History  . Not on file.   Social History Main Topics  . Smoking status: Former Research scientist (life sciences)  . Smokeless tobacco: Not on file  . Alcohol Use: No  . Drug Use: No  . Sexual Activity: Not on file   Other Topics Concern  . Not on file   Social History Narrative     Review of Systems: Constitutional: negative for chills, fever, night sweats, weight changes, or fatigue  HEENT: negative for vision changes, hearing loss, congestion, rhinorrhea, ST, epistaxis, or sinus pressure Cardiovascular: negative for chest pain or  palpitations Respiratory: negative for hemoptysis, wheezing, shortness of breath, or cough Abdominal: negative for abdominal pain, nausea, vomiting, diarrhea, or constipation Dermatological: negative for rash Neurologic: negative for headache, dizziness, or syncope All other systems reviewed and are otherwise negative with the exception to those above and in the HPI.   Physical Exam: Blood pressure 142/82, pulse 88, temperature 98 F (36.7 C), temperature source Oral, resp. rate 16, height 5\' 2"  (1.575 m), weight 133 lb (60.328 kg), SpO2 98 %., Body mass index is 24.32 kg/(m^2). General: Well developed, well nourished, in no acute distress. Head: Normocephalic, atraumatic, eyes without discharge, sclera non-icteric, nares are without discharge. Bilateral auditory canals clear, TM's are without perforation, pearly grey and translucent with reflective cone of light bilaterally. Oral cavity moist, posterior pharynx without exudate, erythema, peritonsillar abscess, or post nasal drip.  Neck: Supple. No thyromegaly. Full ROM. No lymphadenopathy. Lungs: Clear bilaterally to auscultation without wheezes, rales, or rhonchi. Breathing is unlabored. Heart: RRR with S1 S2. No murmurs, rubs, or gallops appreciated. Abdomen: Soft, non-tender, non-distended with normoactive bowel sounds. No hepatomegaly. No rebound/guarding. No obvious abdominal masses. Msk:  Strength and tone normal for age. Extremities/Skin: Warm and dry. No clubbing or cyanosis. No edema. No rashes or suspicious lesions. 1 cm skin flap on right elbow.  FROM of elbow, minimal swelling and no erythema.   Patient has ulnar deviation of both hands with swelling of  MCP joints of index fingers and ulnar deformity of right ring finger Neuro: Alert and oriented X 3. Moves all extremities spontaneously. Gait is normal. CNII-XII grossly in tact. Psych:  Responds to questions appropriately with a normal affect.   ASSESSMENT AND PLAN:  81 y.o.  year old female with This chart was scribed in my presence and reviewed by me personally.    ICD-9-CM ICD-10-CM   1. Open wound of right elbow, initial encounter 881.01 S51.001A   2. Laceration 879.8 T14.8      Wound was cleansed with peroxide and derma bonded  Signed, Robyn Haber, MD 12/19/2014 4:01 PM

## 2015-01-22 DIAGNOSIS — Z23 Encounter for immunization: Secondary | ICD-10-CM | POA: Diagnosis not present

## 2015-02-04 DIAGNOSIS — M810 Age-related osteoporosis without current pathological fracture: Secondary | ICD-10-CM | POA: Diagnosis not present

## 2015-02-04 DIAGNOSIS — I10 Essential (primary) hypertension: Secondary | ICD-10-CM | POA: Diagnosis not present

## 2015-02-10 DIAGNOSIS — L84 Corns and callosities: Secondary | ICD-10-CM | POA: Diagnosis not present

## 2015-02-10 DIAGNOSIS — E7800 Pure hypercholesterolemia, unspecified: Secondary | ICD-10-CM | POA: Diagnosis not present

## 2015-02-10 DIAGNOSIS — I10 Essential (primary) hypertension: Secondary | ICD-10-CM | POA: Diagnosis not present

## 2015-02-10 DIAGNOSIS — M810 Age-related osteoporosis without current pathological fracture: Secondary | ICD-10-CM | POA: Diagnosis not present

## 2015-02-15 DIAGNOSIS — M2011 Hallux valgus (acquired), right foot: Secondary | ICD-10-CM | POA: Diagnosis not present

## 2015-02-15 DIAGNOSIS — M2041 Other hammer toe(s) (acquired), right foot: Secondary | ICD-10-CM | POA: Diagnosis not present

## 2015-02-15 DIAGNOSIS — M2012 Hallux valgus (acquired), left foot: Secondary | ICD-10-CM | POA: Diagnosis not present

## 2015-02-15 DIAGNOSIS — M2042 Other hammer toe(s) (acquired), left foot: Secondary | ICD-10-CM | POA: Diagnosis not present

## 2015-03-16 DIAGNOSIS — M2041 Other hammer toe(s) (acquired), right foot: Secondary | ICD-10-CM | POA: Diagnosis not present

## 2015-03-16 DIAGNOSIS — M2011 Hallux valgus (acquired), right foot: Secondary | ICD-10-CM | POA: Diagnosis not present

## 2015-03-16 DIAGNOSIS — M2042 Other hammer toe(s) (acquired), left foot: Secondary | ICD-10-CM | POA: Diagnosis not present

## 2015-03-16 DIAGNOSIS — M2012 Hallux valgus (acquired), left foot: Secondary | ICD-10-CM | POA: Diagnosis not present

## 2015-03-25 IMAGING — CT CT HEAD W/O CM
2 series · 16 of 30 positions shown, 20 images · non-contrast
Comparison: Prior CT of the head images from 4883 unavailable

CLINICAL DATA: Headache, loss of smell and taste, worsening
right-sided headaches for 6 months, dizziness

EXAM:
CT HEAD WITHOUT CONTRAST
TECHNIQUE: Contiguous axial images were obtained from the base of the skull
through the vertex without intravenous contrast.

[Series 3: head bone · axial · 0.49mm/px · z∈[+33,+75]mm · 3 of 28 slices shown]
[im 2/28  bone]
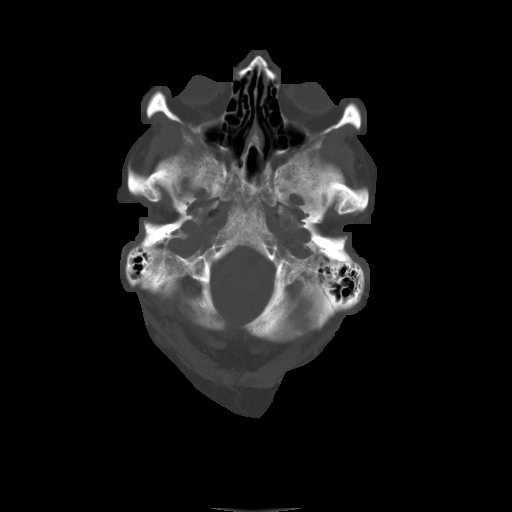
[im 6/28  bone]
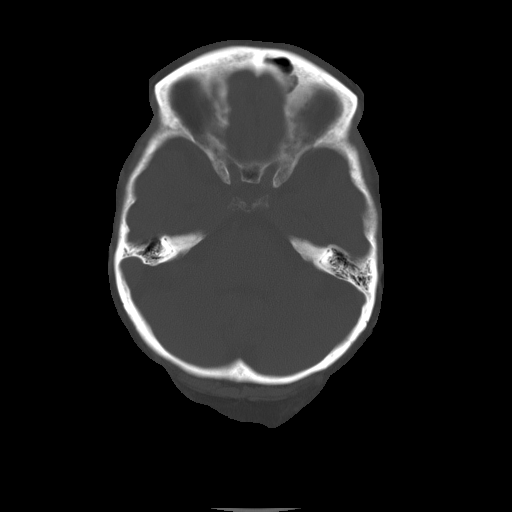
[im 10/28  bone]
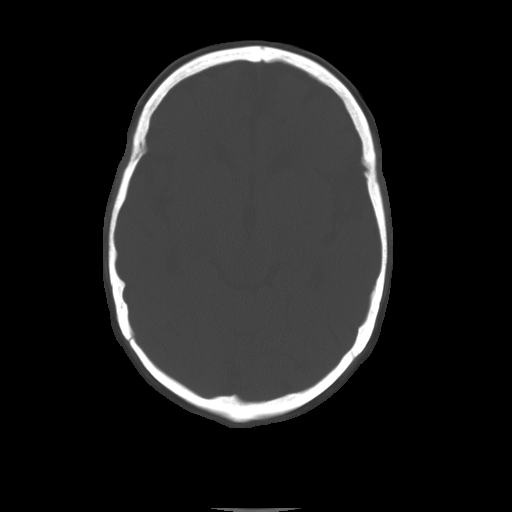

[Series 32: 3d filtered head w/o · axial · non-contrast · 0.49mm/px · z∈[+33,+159]mm · 13 of 28 slices shown, 17 images]
[im 2/28  brain]
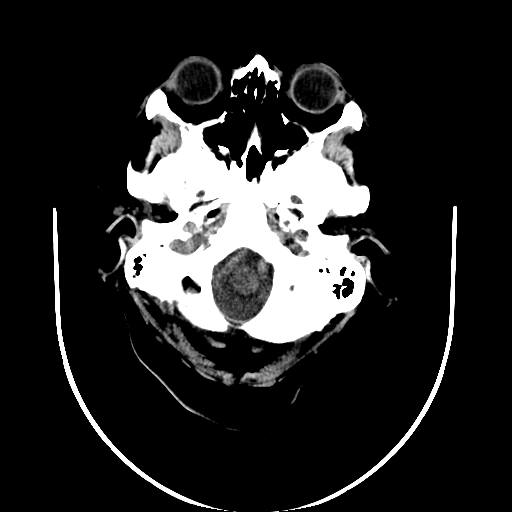
[im 2/28  bone]
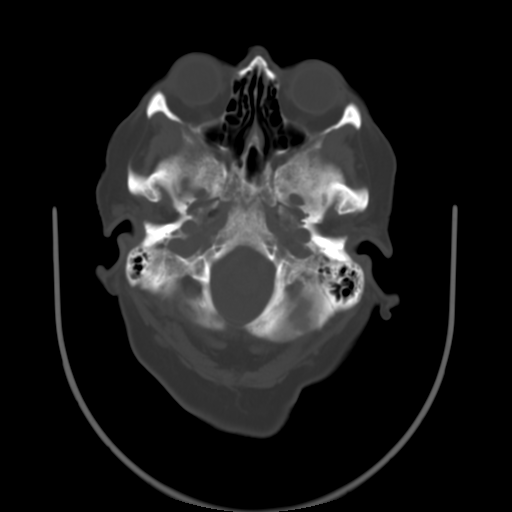
[im 4/28  brain]
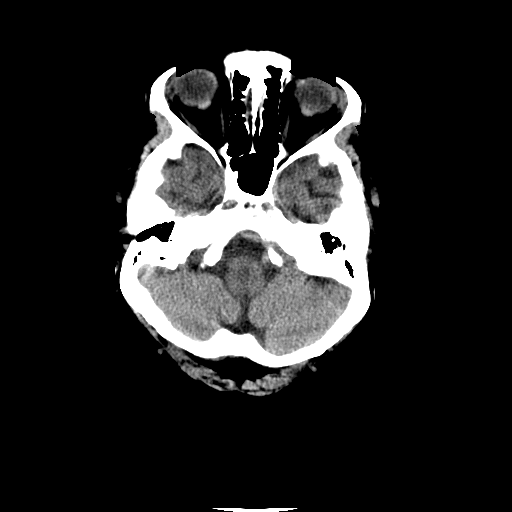
[im 6/28  brain]
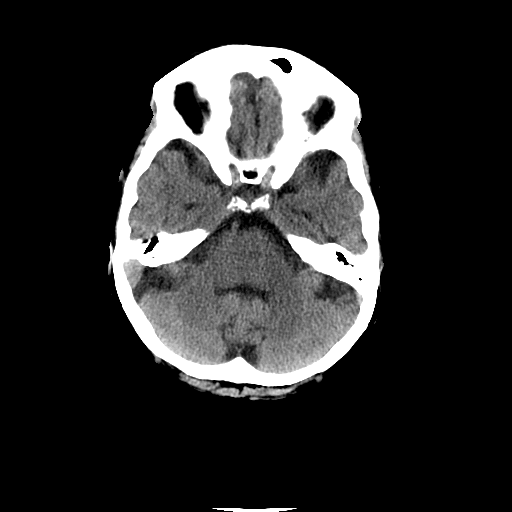
[im 8/28  brain]
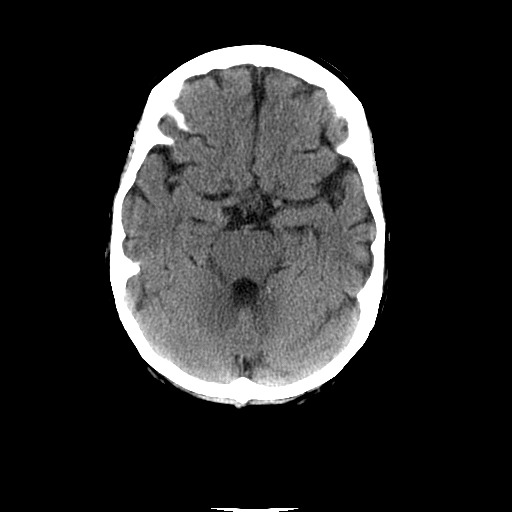
[im 10/28  brain]
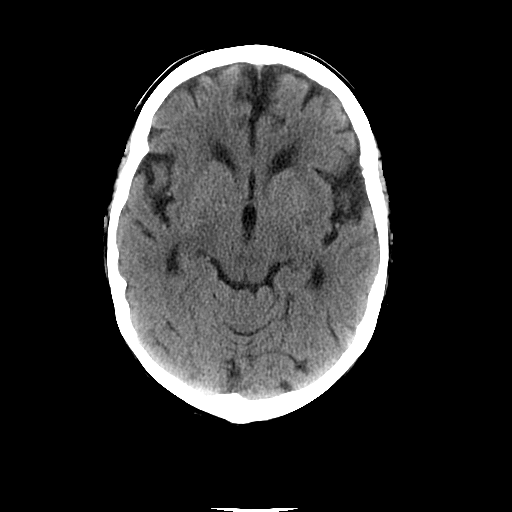
[im 10/28  bone]
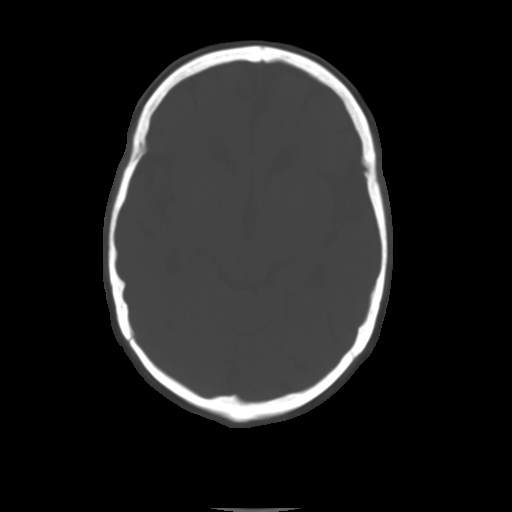
[im 12/28  brain]
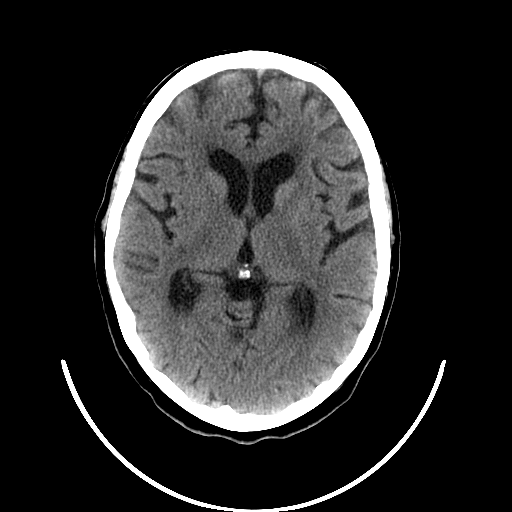
[im 14/28  brain]
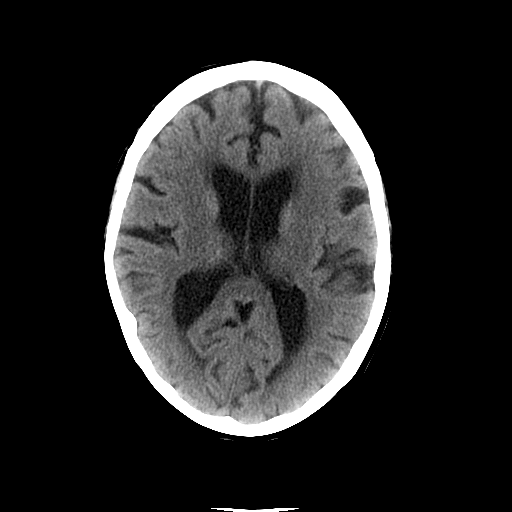
[im 16/28  brain]
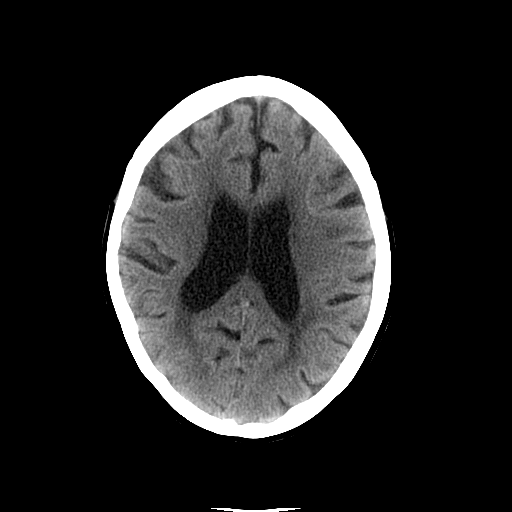
[im 18/28  brain]
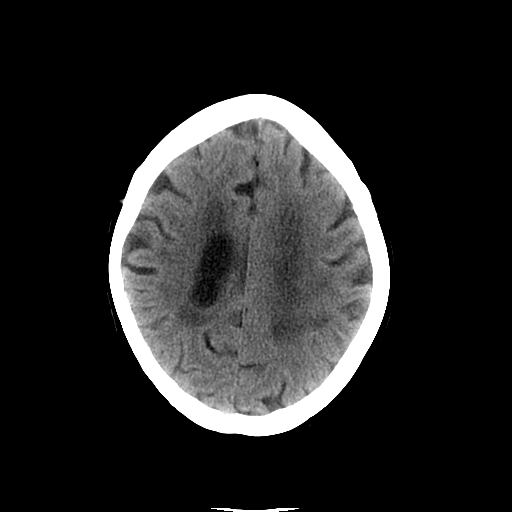
[im 18/28  bone]
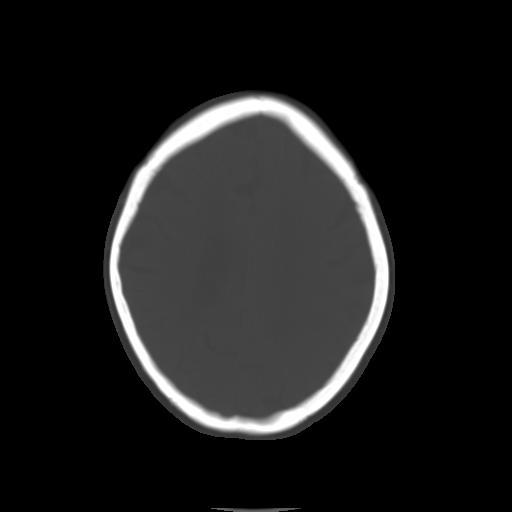
[im 20/28  brain]
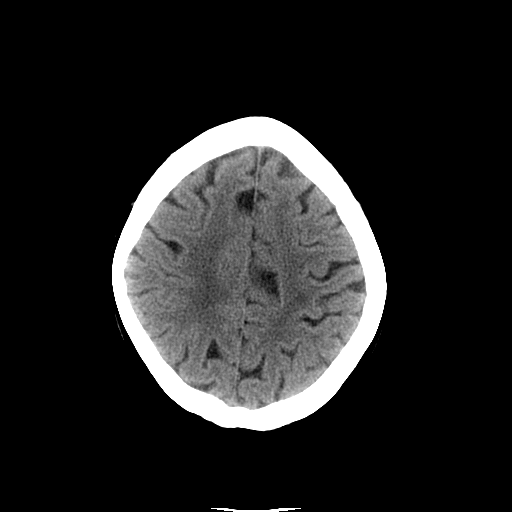
[im 22/28  brain]
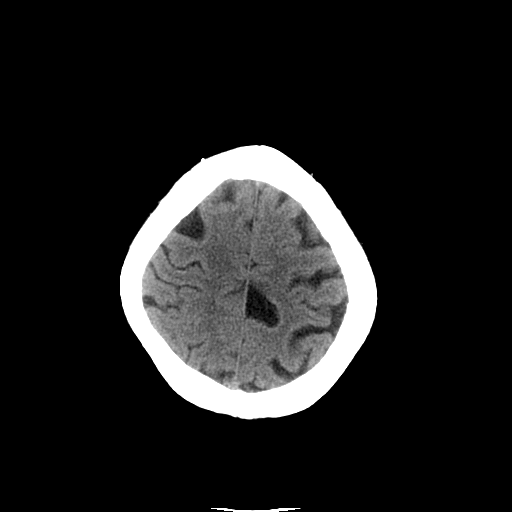
[im 24/28  brain]
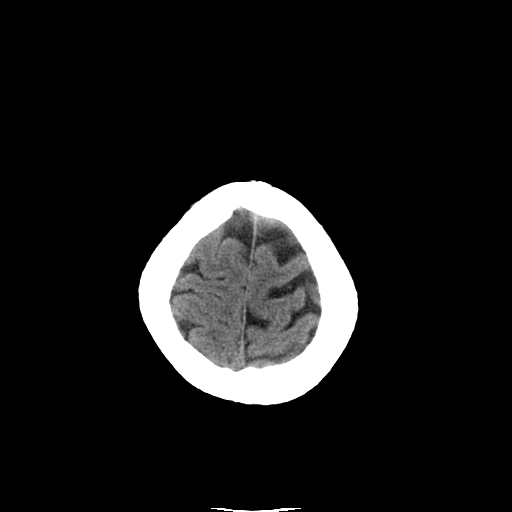
[im 26/28  brain]
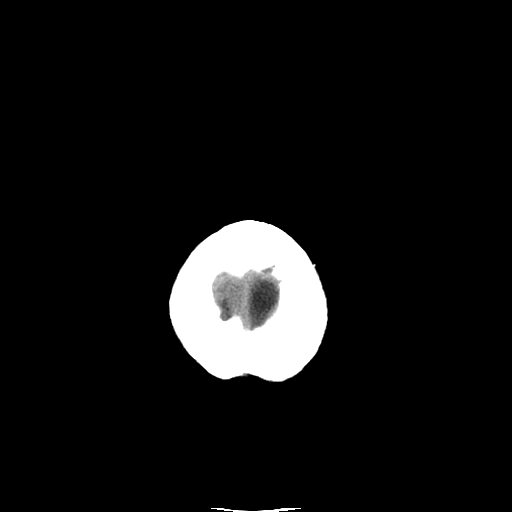
[im 26/28  bone]
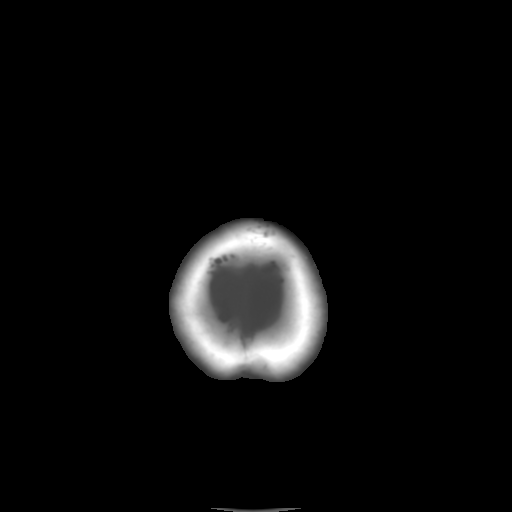

[16 of 30 positions shown; findings below may reference images not displayed]

FINDINGS: There is moderate to severe age-related atrophy. There is moderately
severe low attenuation in the deep white matter. Possible
subcentimeter lacunar infarct right thalamus age uncertain. Possible
lacunar infarct left basal ganglia age uncertain. Calvarium intact.
No hydrocephalus.
IMPRESSION: Age-related involutional change, with possible tiny bilateral
lacunar infarctions. No acute intracranial abnormalities.

## 2015-05-19 DIAGNOSIS — Z961 Presence of intraocular lens: Secondary | ICD-10-CM | POA: Diagnosis not present

## 2015-05-19 DIAGNOSIS — H26493 Other secondary cataract, bilateral: Secondary | ICD-10-CM | POA: Diagnosis not present

## 2015-05-19 DIAGNOSIS — H52203 Unspecified astigmatism, bilateral: Secondary | ICD-10-CM | POA: Diagnosis not present

## 2015-06-10 DIAGNOSIS — H26492 Other secondary cataract, left eye: Secondary | ICD-10-CM | POA: Diagnosis not present

## 2015-06-10 DIAGNOSIS — H2640 Unspecified secondary cataract: Secondary | ICD-10-CM | POA: Diagnosis not present

## 2015-06-17 DIAGNOSIS — H2640 Unspecified secondary cataract: Secondary | ICD-10-CM | POA: Diagnosis not present

## 2015-06-17 DIAGNOSIS — H26491 Other secondary cataract, right eye: Secondary | ICD-10-CM | POA: Diagnosis not present

## 2015-08-11 DIAGNOSIS — I10 Essential (primary) hypertension: Secondary | ICD-10-CM | POA: Diagnosis not present

## 2015-08-11 DIAGNOSIS — N390 Urinary tract infection, site not specified: Secondary | ICD-10-CM | POA: Diagnosis not present

## 2015-08-11 DIAGNOSIS — M810 Age-related osteoporosis without current pathological fracture: Secondary | ICD-10-CM | POA: Diagnosis not present

## 2015-08-11 DIAGNOSIS — E559 Vitamin D deficiency, unspecified: Secondary | ICD-10-CM | POA: Diagnosis not present

## 2015-08-18 DIAGNOSIS — Z0000 Encounter for general adult medical examination without abnormal findings: Secondary | ICD-10-CM | POA: Diagnosis not present

## 2015-08-18 DIAGNOSIS — I10 Essential (primary) hypertension: Secondary | ICD-10-CM | POA: Diagnosis not present

## 2015-08-18 DIAGNOSIS — E7800 Pure hypercholesterolemia, unspecified: Secondary | ICD-10-CM | POA: Diagnosis not present

## 2016-01-11 DIAGNOSIS — Z23 Encounter for immunization: Secondary | ICD-10-CM | POA: Diagnosis not present

## 2016-02-03 DIAGNOSIS — Z961 Presence of intraocular lens: Secondary | ICD-10-CM | POA: Diagnosis not present

## 2016-02-03 DIAGNOSIS — H3500 Unspecified background retinopathy: Secondary | ICD-10-CM | POA: Diagnosis not present

## 2016-02-03 DIAGNOSIS — H52203 Unspecified astigmatism, bilateral: Secondary | ICD-10-CM | POA: Diagnosis not present

## 2016-02-03 DIAGNOSIS — H43813 Vitreous degeneration, bilateral: Secondary | ICD-10-CM | POA: Diagnosis not present

## 2016-02-14 DIAGNOSIS — I10 Essential (primary) hypertension: Secondary | ICD-10-CM | POA: Diagnosis not present

## 2016-02-17 DIAGNOSIS — E7800 Pure hypercholesterolemia, unspecified: Secondary | ICD-10-CM | POA: Diagnosis not present

## 2016-02-17 DIAGNOSIS — I10 Essential (primary) hypertension: Secondary | ICD-10-CM | POA: Diagnosis not present

## 2016-06-28 DIAGNOSIS — M064 Inflammatory polyarthropathy: Secondary | ICD-10-CM | POA: Diagnosis not present

## 2016-06-28 DIAGNOSIS — M79643 Pain in unspecified hand: Secondary | ICD-10-CM | POA: Diagnosis not present

## 2016-06-28 DIAGNOSIS — M19041 Primary osteoarthritis, right hand: Secondary | ICD-10-CM | POA: Diagnosis not present

## 2016-06-28 DIAGNOSIS — M1990 Unspecified osteoarthritis, unspecified site: Secondary | ICD-10-CM | POA: Diagnosis not present

## 2016-06-28 DIAGNOSIS — M19042 Primary osteoarthritis, left hand: Secondary | ICD-10-CM | POA: Diagnosis not present

## 2016-07-05 DIAGNOSIS — M79643 Pain in unspecified hand: Secondary | ICD-10-CM | POA: Diagnosis not present

## 2016-07-05 DIAGNOSIS — M064 Inflammatory polyarthropathy: Secondary | ICD-10-CM | POA: Diagnosis not present

## 2016-07-05 DIAGNOSIS — M1990 Unspecified osteoarthritis, unspecified site: Secondary | ICD-10-CM | POA: Diagnosis not present

## 2016-07-11 DIAGNOSIS — R21 Rash and other nonspecific skin eruption: Secondary | ICD-10-CM | POA: Diagnosis not present

## 2016-07-14 DIAGNOSIS — L309 Dermatitis, unspecified: Secondary | ICD-10-CM | POA: Diagnosis not present

## 2016-07-14 DIAGNOSIS — D225 Melanocytic nevi of trunk: Secondary | ICD-10-CM | POA: Diagnosis not present

## 2016-08-02 DIAGNOSIS — H3500 Unspecified background retinopathy: Secondary | ICD-10-CM | POA: Diagnosis not present

## 2016-08-02 DIAGNOSIS — H04123 Dry eye syndrome of bilateral lacrimal glands: Secondary | ICD-10-CM | POA: Diagnosis not present

## 2016-08-02 DIAGNOSIS — Z961 Presence of intraocular lens: Secondary | ICD-10-CM | POA: Diagnosis not present

## 2016-08-02 DIAGNOSIS — H43813 Vitreous degeneration, bilateral: Secondary | ICD-10-CM | POA: Diagnosis not present

## 2016-08-04 DIAGNOSIS — L309 Dermatitis, unspecified: Secondary | ICD-10-CM | POA: Diagnosis not present

## 2016-08-15 DIAGNOSIS — I10 Essential (primary) hypertension: Secondary | ICD-10-CM | POA: Diagnosis not present

## 2016-08-21 DIAGNOSIS — Z0000 Encounter for general adult medical examination without abnormal findings: Secondary | ICD-10-CM | POA: Diagnosis not present

## 2016-08-21 DIAGNOSIS — Z23 Encounter for immunization: Secondary | ICD-10-CM | POA: Diagnosis not present

## 2016-08-21 DIAGNOSIS — I10 Essential (primary) hypertension: Secondary | ICD-10-CM | POA: Diagnosis not present

## 2016-10-05 DIAGNOSIS — Z7952 Long term (current) use of systemic steroids: Secondary | ICD-10-CM | POA: Diagnosis not present

## 2016-10-05 DIAGNOSIS — M1990 Unspecified osteoarthritis, unspecified site: Secondary | ICD-10-CM | POA: Diagnosis not present

## 2016-10-05 DIAGNOSIS — M79643 Pain in unspecified hand: Secondary | ICD-10-CM | POA: Diagnosis not present

## 2016-10-05 DIAGNOSIS — M064 Inflammatory polyarthropathy: Secondary | ICD-10-CM | POA: Diagnosis not present

## 2016-12-05 DIAGNOSIS — M064 Inflammatory polyarthropathy: Secondary | ICD-10-CM | POA: Diagnosis not present

## 2016-12-05 DIAGNOSIS — M1990 Unspecified osteoarthritis, unspecified site: Secondary | ICD-10-CM | POA: Diagnosis not present

## 2016-12-05 DIAGNOSIS — M79643 Pain in unspecified hand: Secondary | ICD-10-CM | POA: Diagnosis not present

## 2016-12-05 DIAGNOSIS — Z7952 Long term (current) use of systemic steroids: Secondary | ICD-10-CM | POA: Diagnosis not present

## 2017-01-16 DIAGNOSIS — Z7952 Long term (current) use of systemic steroids: Secondary | ICD-10-CM | POA: Diagnosis not present

## 2017-01-16 DIAGNOSIS — M79643 Pain in unspecified hand: Secondary | ICD-10-CM | POA: Diagnosis not present

## 2017-01-16 DIAGNOSIS — M1990 Unspecified osteoarthritis, unspecified site: Secondary | ICD-10-CM | POA: Diagnosis not present

## 2017-01-16 DIAGNOSIS — M064 Inflammatory polyarthropathy: Secondary | ICD-10-CM | POA: Diagnosis not present

## 2017-02-05 DIAGNOSIS — Z23 Encounter for immunization: Secondary | ICD-10-CM | POA: Diagnosis not present

## 2017-04-24 DIAGNOSIS — M79643 Pain in unspecified hand: Secondary | ICD-10-CM | POA: Diagnosis not present

## 2017-04-24 DIAGNOSIS — M1990 Unspecified osteoarthritis, unspecified site: Secondary | ICD-10-CM | POA: Diagnosis not present

## 2017-04-24 DIAGNOSIS — M064 Inflammatory polyarthropathy: Secondary | ICD-10-CM | POA: Diagnosis not present

## 2017-04-24 DIAGNOSIS — Z7952 Long term (current) use of systemic steroids: Secondary | ICD-10-CM | POA: Diagnosis not present

## 2017-04-25 DIAGNOSIS — N390 Urinary tract infection, site not specified: Secondary | ICD-10-CM | POA: Diagnosis not present

## 2017-04-25 DIAGNOSIS — R300 Dysuria: Secondary | ICD-10-CM | POA: Diagnosis not present

## 2017-05-05 ENCOUNTER — Encounter (HOSPITAL_COMMUNITY): Payer: Self-pay | Admitting: Emergency Medicine

## 2017-05-05 ENCOUNTER — Emergency Department (HOSPITAL_COMMUNITY)
Admission: EM | Admit: 2017-05-05 | Discharge: 2017-05-05 | Disposition: A | Discharge status: Home / Self Care | Payer: Medicare Other | Attending: Emergency Medicine | Admitting: Emergency Medicine

## 2017-05-05 ENCOUNTER — Emergency Department (HOSPITAL_COMMUNITY): Payer: Medicare Other

## 2017-05-05 DIAGNOSIS — Z79899 Other long term (current) drug therapy: Secondary | ICD-10-CM | POA: Insufficient documentation

## 2017-05-05 DIAGNOSIS — W0110XA Fall on same level from slipping, tripping and stumbling with subsequent striking against unspecified object, initial encounter: Secondary | ICD-10-CM | POA: Diagnosis not present

## 2017-05-05 DIAGNOSIS — S0990XA Unspecified injury of head, initial encounter: Secondary | ICD-10-CM

## 2017-05-05 DIAGNOSIS — Y929 Unspecified place or not applicable: Secondary | ICD-10-CM | POA: Diagnosis not present

## 2017-05-05 DIAGNOSIS — S61412A Laceration without foreign body of left hand, initial encounter: Secondary | ICD-10-CM | POA: Diagnosis not present

## 2017-05-05 DIAGNOSIS — Z7982 Long term (current) use of aspirin: Secondary | ICD-10-CM | POA: Diagnosis not present

## 2017-05-05 DIAGNOSIS — Z96653 Presence of artificial knee joint, bilateral: Secondary | ICD-10-CM | POA: Insufficient documentation

## 2017-05-05 DIAGNOSIS — Y9301 Activity, walking, marching and hiking: Secondary | ICD-10-CM | POA: Diagnosis not present

## 2017-05-05 DIAGNOSIS — S60512A Abrasion of left hand, initial encounter: Secondary | ICD-10-CM | POA: Diagnosis not present

## 2017-05-05 DIAGNOSIS — S0181XA Laceration without foreign body of other part of head, initial encounter: Secondary | ICD-10-CM

## 2017-05-05 DIAGNOSIS — Z87891 Personal history of nicotine dependence: Secondary | ICD-10-CM | POA: Insufficient documentation

## 2017-05-05 DIAGNOSIS — T07XXXA Unspecified multiple injuries, initial encounter: Secondary | ICD-10-CM

## 2017-05-05 DIAGNOSIS — S01111A Laceration without foreign body of right eyelid and periocular area, initial encounter: Secondary | ICD-10-CM | POA: Insufficient documentation

## 2017-05-05 DIAGNOSIS — I10 Essential (primary) hypertension: Secondary | ICD-10-CM | POA: Insufficient documentation

## 2017-05-05 DIAGNOSIS — Y999 Unspecified external cause status: Secondary | ICD-10-CM | POA: Insufficient documentation

## 2017-05-05 DIAGNOSIS — S199XXA Unspecified injury of neck, initial encounter: Secondary | ICD-10-CM | POA: Diagnosis not present

## 2017-05-05 DIAGNOSIS — S0510XA Contusion of eyeball and orbital tissues, unspecified eye, initial encounter: Secondary | ICD-10-CM | POA: Diagnosis not present

## 2017-05-05 MED ORDER — IBUPROFEN 200 MG PO TABS
600.0000 mg | ORAL_TABLET | Freq: Once | ORAL | Status: AC
  Administered 2017-05-05: 600 mg via ORAL
  Filled 2017-05-05: qty 3

## 2017-05-05 MED ORDER — LIDOCAINE-EPINEPHRINE (PF) 2 %-1:200000 IJ SOLN
10.0000 mL | Freq: Once | INTRAMUSCULAR | Status: AC
  Administered 2017-05-05: 10 mL
  Filled 2017-05-05: qty 20

## 2017-05-05 MED ORDER — BACITRACIN-NEOMYCIN-POLYMYXIN 400-5-5000 EX OINT
TOPICAL_OINTMENT | Freq: Once | CUTANEOUS | Status: AC
  Administered 2017-05-05: 20:00:00 via TOPICAL
  Filled 2017-05-05: qty 1

## 2017-05-05 MED ORDER — ACETAMINOPHEN 325 MG PO TABS
650.0000 mg | ORAL_TABLET | Freq: Once | ORAL | Status: DC
  Filled 2017-05-05: qty 2

## 2017-05-05 NOTE — ED Provider Notes (Signed)
[facility] DEPT Provider Note   CSN: [id number] Arrival date & time: 05/05/17  1554     History   Chief Complaint Chief Complaint  Patient presents with  . Fall  . Head Injury  . Hand Injury    HPI [name] is a 84 y.o. female.  Pt presents to the ED today s/p fall.  The pt said she stumbled and fell onto concrete.  She c/o pain to her face.  She sustained a lac to her right eyebrow and left hand.  She denies loc.  She's not on blood thinners.      Past Medical History:  Diagnosis Date  . Hypertension     Patient Active Problem List   Diagnosis Date Noted  . High blood pressure 01/15/2012    Past Surgical History:  Procedure Laterality Date  . APPENDECTOMY    . BREAST BIOPSY    . HERNIA REPAIR    . REPLACEMENT TOTAL KNEE BILATERAL      OB History    No data available       Home Medications    Prior to Admission medications   Medication Sig Start Date End Date Taking? Authorizing Provider  albuterol (PROVENTIL HFA;VENTOLIN HFA) 108 (90 BASE) MCG/ACT inhaler Inhale into the lungs every 6 (six) hours as needed for wheezing or shortness of breath.    [provider]  aspirin 81 MG tablet Take 81 mg by mouth daily.    [provider]  calcium carbonate (OS-CAL) 600 MG TABS Take 600 mg by mouth daily.    [provider]  cephALEXin (KEFLEX) 500 MG capsule Take 1 capsule (500 mg total) by mouth 2 (two) times daily. Patient not taking: Reported on 12/19/2014 06/02/14   Posey Boyer, MD  clindamycin (CLEOCIN) 150 MG capsule Take by mouth 3 (three) times daily.    [provider]  fish oil-omega-3 fatty acids 1000 MG capsule Take 1 g by mouth daily.    [provider]  hydrochlorothiazide (MICROZIDE) 12.5 MG capsule Take 12.5 mg by mouth. Pt takes 3 times a week    [provider]  ibuprofen (ADVIL,MOTRIN) 200 MG tablet Take 200 mg by mouth every 6 (six) hours as needed.     [provider]  lisinopril (PRINIVIL,ZESTRIL) 10 MG tablet Take 10 mg by mouth daily.    [provider]  magnesium citrate SOLN Take 1 Bottle by mouth once.    [provider]  metoprolol succinate (TOPROL-XL) 25 MG 24 hr tablet Take 25 mg by mouth daily.    [provider]  nitroGLYCERIN (NITROSTAT) 0.4 MG SL tablet Place 0.4 mg under the tongue every 5 (five) minutes as needed for chest pain.    [provider]  Probiotic Product (PROBIOTIC DAILY PO) Take by mouth.    [provider]  simvastatin (ZOCOR) 20 MG tablet Take 20 mg by mouth daily.    [provider]  sulfamethoxazole-trimethoprim (BACTRIM DS,SEPTRA DS) 800-160 MG per tablet Take 1 tablet by mouth 2 (two) times daily. Patient not taking: Reported on 12/19/2014 05/22/14   Posey Boyer, MD  Thiamine HCl (VITAMIN B-1) 250 MG tablet Take 250 mg by mouth daily.    [provider]  vitamin C (ASCORBIC ACID) 500 MG tablet Take 500 mg by mouth daily.    [provider]    Family History Family History  Problem Relation Age of Onset  . Cancer Mother   .  Stroke Father   . Hypertension Father     Social History Social History   Tobacco Use  . Smoking status: Former Smoker  Substance Use Topics  . Alcohol use: No  . Drug use: No     Allergies   Amoxicillin and Penicillins   Review of Systems Review of Systems  HENT: Positive for facial swelling.   Skin: Positive for wound.  Neurological: Positive for headaches.  All other systems reviewed and are negative.    Physical Exam Updated Vital Signs BP (!) 169/88 (BP Location: Left Arm)   Pulse 64   Temp 98.2 F (36.8 C)   Resp 16   SpO2 98%   Physical Exam  Constitutional: She is oriented to person, place, and time. She appears well-developed and well-nourished.  HENT:  Head: Normocephalic.    Right Ear: External ear normal.  Left Ear: External ear normal.  Nose: Nose normal.    Mouth/Throat: Oropharynx is clear and moist.  Eyes: Conjunctivae and EOM are normal. Pupils are equal, round, and reactive to light.  Neck: Normal range of motion. Neck supple.  Cardiovascular: Normal rate, regular rhythm, normal heart sounds and intact distal pulses.  Pulmonary/Chest: Effort normal and breath sounds normal.  Abdominal: Soft. Bowel sounds are normal.  Musculoskeletal: Normal range of motion.       Left hand: She exhibits laceration.  Neurological: She is alert and oriented to person, place, and time.  Skin: Skin is warm and dry. Capillary refill takes less than 2 seconds.  Psychiatric: She has a normal mood and affect. Her behavior is normal. Judgment and thought content normal.  Nursing note and vitals reviewed.    ED Treatments / Results  Labs (all labs ordered are listed, but only abnormal results are displayed) Labs Reviewed - No data to display  EKG  EKG Interpretation None       Radiology Ct Head Wo Contrast  Result Date: 05/05/2017 CLINICAL DATA:  [place] today, stumbled on concrete, large LEFT supraorbital hematoma and small laceration EXAM: CT HEAD WITHOUT CONTRAST CT MAXILLOFACIAL WITHOUT CONTRAST CT CERVICAL SPINE WITHOUT CONTRAST TECHNIQUE: Multidetector CT imaging of the head, cervical spine, and maxillofacial structures were performed using the standard protocol without intravenous contrast. Multiplanar CT image reconstructions of the cervical spine and maxillofacial structures were also generated. COMPARISON:  CT head 09/30/2013 FINDINGS: CT HEAD FINDINGS Brain: Generalized atrophy. Normal ventricular morphology. No midline shift or mass effect. Small vessel chronic ischemic changes of deep cerebral white matter. No intracranial hemorrhage, mass lesion, evidence of acute infarction, or extra-axial fluid collection. Vascular: Unremarkable Skull: Intact. Large RIGHT frontal scalp hematoma extending RIGHT periorbital Other: N/A CT MAXILLOFACIAL FINDINGS  Osseous: Osseous mineralization normal. Nasal septal deviation to the LEFT. Calvaria intact. Zygomas, orbits and sinuses intact. TMJ degenerative changes without dislocation. No acute facial bone abnormalities identified. Scattered beam hardening artifacts of dental origin. Orbits: Intraorbital soft tissue planes clear. Sinuses: Minimal mucosal thickening in the maxillary sinuses bilaterally. Remaining paranasal sinuses, mastoid air cells and middle ear cavities clear Soft tissues: Large RIGHT periorbital hematoma extending into RIGHT frontal region and overlying RIGHT zygoma. Soft tissue swelling extends the RIGHT infraorbital into the maxillary region. No additional regional soft tissue abnormalities. Few scattered normal sized anterior cervical lymph nodes. CT CERVICAL SPINE FINDINGS Alignment: Normal Skull base and vertebrae: Visualized skull base intact. Diffuse osseous demineralization. Disc space narrowing with endplate spur formation at C5-C6 to C7-T1. Multilevel facet degenerative changes. Vertebral body heights maintained without fracture or subluxation.  Soft tissues and spinal canal: Prevertebral soft tissues normal thickness. Remaining cervical soft tissues unremarkable. Disc levels:  No additional abnormalities Upper chest: Lung apices clear Other: Atherosclerotic calcifications at the carotid bifurcations bilaterally. IMPRESSION: Atrophy with small vessel chronic ischemic changes of deep cerebral white matter. No acute intracranial abnormalities. Multilevel degenerative disc and facet disease changes of the cervical spine. No acute cervical spine abnormalities. Large RIGHT frontal and periorbital scalp hematoma extending to the lateral RIGHT face and to the RIGHT maxillary region. No acute facial bone abnormalities. Electronically Signed   By: Lavonia Dana M.D.   On: 05/05/2017 17:26   Ct Cervical Spine Wo Contrast  Result Date: 05/05/2017 CLINICAL DATA:  [place] today, stumbled on concrete, large LEFT  supraorbital hematoma and small laceration EXAM: CT HEAD WITHOUT CONTRAST CT MAXILLOFACIAL WITHOUT CONTRAST CT CERVICAL SPINE WITHOUT CONTRAST TECHNIQUE: Multidetector CT imaging of the head, cervical spine, and maxillofacial structures were performed using the standard protocol without intravenous contrast. Multiplanar CT image reconstructions of the cervical spine and maxillofacial structures were also generated. COMPARISON:  CT head 09/30/2013 FINDINGS: CT HEAD FINDINGS Brain: Generalized atrophy. Normal ventricular morphology. No midline shift or mass effect. Small vessel chronic ischemic changes of deep cerebral white matter. No intracranial hemorrhage, mass lesion, evidence of acute infarction, or extra-axial fluid collection. Vascular: Unremarkable Skull: Intact. Large RIGHT frontal scalp hematoma extending RIGHT periorbital Other: N/A CT MAXILLOFACIAL FINDINGS Osseous: Osseous mineralization normal. Nasal septal deviation to the LEFT. Calvaria intact. Zygomas, orbits and sinuses intact. TMJ degenerative changes without dislocation. No acute facial bone abnormalities identified. Scattered beam hardening artifacts of dental origin. Orbits: Intraorbital soft tissue planes clear. Sinuses: Minimal mucosal thickening in the maxillary sinuses bilaterally. Remaining paranasal sinuses, mastoid air cells and middle ear cavities clear Soft tissues: Large RIGHT periorbital hematoma extending into RIGHT frontal region and overlying RIGHT zygoma. Soft tissue swelling extends the RIGHT infraorbital into the maxillary region. No additional regional soft tissue abnormalities. Few scattered normal sized anterior cervical lymph nodes. CT CERVICAL SPINE FINDINGS Alignment: Normal Skull base and vertebrae: Visualized skull base intact. Diffuse osseous demineralization. Disc space narrowing with endplate spur formation at C5-C6 to C7-T1. Multilevel facet degenerative changes. Vertebral body heights maintained without fracture or  subluxation. Soft tissues and spinal canal: Prevertebral soft tissues normal thickness. Remaining cervical soft tissues unremarkable. Disc levels:  No additional abnormalities Upper chest: Lung apices clear Other: Atherosclerotic calcifications at the carotid bifurcations bilaterally. IMPRESSION: Atrophy with small vessel chronic ischemic changes of deep cerebral white matter. No acute intracranial abnormalities. Multilevel degenerative disc and facet disease changes of the cervical spine. No acute cervical spine abnormalities. Large RIGHT frontal and periorbital scalp hematoma extending to the lateral RIGHT face and to the RIGHT maxillary region. No acute facial bone abnormalities. Electronically Signed   By: Lavonia Dana M.D.   On: 05/05/2017 17:26   Ct Maxillofacial Wo Contrast  Result Date: 05/05/2017 CLINICAL DATA:  [place] today, stumbled on concrete, large LEFT supraorbital hematoma and small laceration EXAM: CT HEAD WITHOUT CONTRAST CT MAXILLOFACIAL WITHOUT CONTRAST CT CERVICAL SPINE WITHOUT CONTRAST TECHNIQUE: Multidetector CT imaging of the head, cervical spine, and maxillofacial structures were performed using the standard protocol without intravenous contrast. Multiplanar CT image reconstructions of the cervical spine and maxillofacial structures were also generated. COMPARISON:  CT head 09/30/2013 FINDINGS: CT HEAD FINDINGS Brain: Generalized atrophy. Normal ventricular morphology. No midline shift or mass effect. Small vessel chronic ischemic changes of deep cerebral white matter. No intracranial hemorrhage, mass lesion, evidence  of acute infarction, or extra-axial fluid collection. Vascular: Unremarkable Skull: Intact. Large RIGHT frontal scalp hematoma extending RIGHT periorbital Other: N/A CT MAXILLOFACIAL FINDINGS Osseous: Osseous mineralization normal. Nasal septal deviation to the LEFT. Calvaria intact. Zygomas, orbits and sinuses intact. TMJ degenerative changes without dislocation. No acute  facial bone abnormalities identified. Scattered beam hardening artifacts of dental origin. Orbits: Intraorbital soft tissue planes clear. Sinuses: Minimal mucosal thickening in the maxillary sinuses bilaterally. Remaining paranasal sinuses, mastoid air cells and middle ear cavities clear Soft tissues: Large RIGHT periorbital hematoma extending into RIGHT frontal region and overlying RIGHT zygoma. Soft tissue swelling extends the RIGHT infraorbital into the maxillary region. No additional regional soft tissue abnormalities. Few scattered normal sized anterior cervical lymph nodes. CT CERVICAL SPINE FINDINGS Alignment: Normal Skull base and vertebrae: Visualized skull base intact. Diffuse osseous demineralization. Disc space narrowing with endplate spur formation at C5-C6 to C7-T1. Multilevel facet degenerative changes. Vertebral body heights maintained without fracture or subluxation. Soft tissues and spinal canal: Prevertebral soft tissues normal thickness. Remaining cervical soft tissues unremarkable. Disc levels:  No additional abnormalities Upper chest: Lung apices clear Other: Atherosclerotic calcifications at the carotid bifurcations bilaterally. IMPRESSION: Atrophy with small vessel chronic ischemic changes of deep cerebral white matter. No acute intracranial abnormalities. Multilevel degenerative disc and facet disease changes of the cervical spine. No acute cervical spine abnormalities. Large RIGHT frontal and periorbital scalp hematoma extending to the lateral RIGHT face and to the RIGHT maxillary region. No acute facial bone abnormalities. Electronically Signed   By: Lavonia Dana M.D.   On: 05/05/2017 17:26    Procedures Procedures (including critical care time)  Medications Ordered in ED Medications  acetaminophen (TYLENOL) tablet 650 mg (650 mg Oral Refused 05/05/17 1837)  ibuprofen (ADVIL,MOTRIN) tablet 600 mg (not administered)  neomycin-bacitracin-polymyxin (NEOSPORIN) ointment (not  administered)  lidocaine-EPINEPHrine (XYLOCAINE W/EPI) 2 %-1:200000 (PF) injection 10 mL (10 mLs Infiltration Given 05/05/17 1736)     Initial Impression / Assessment and Plan / ED Course  I have reviewed the triage vital signs and the nursing notes.  Pertinent labs & imaging results that were available during my care of the patient were reviewed by me and considered in my medical decision making (see chart for details).     Lac repaired by PA Couture.  Pt's MS has remained normal.  Pt is stable for d/c.  Return if worse.  Final Clinical Impressions(s) / ED Diagnoses   Final diagnoses:  Injury of head, initial encounter  Facial laceration, initial encounter  Multiple abrasions    ED Discharge Orders    None       Isla Pence, MD 05/05/17 1904

## 2017-05-05 NOTE — ED Triage Notes (Signed)
Pt comes in after a fall today where she stumbled and fell onto concrete.  Large hematoma with swelling noted over left orbital with small laceration noted.  Additional laceration noted to left hand. Bleeding controlled at this time. Neuro intact. Did not lose consciousness. A&O x4.  Denies blood thinner use.

## 2017-05-05 NOTE — ED Provider Notes (Signed)
..  Laceration Repair Date/Time: 05/05/2017 6:57 PM Performed by: Rodney Booze, PA-C Authorized by: Rodney Booze, PA-C   Consent:    Consent obtained:  Verbal   Consent given by:  Patient   Risks discussed:  Infection and pain   Alternatives discussed:  No treatment Anesthesia (see MAR for exact dosages):    Anesthesia method:  Local infiltration   Local anesthetic:  Lidocaine 2% WITH epi Laceration details:    Location:  Face   Facial location: right eyebrow.   Length (cm):  1.5 Repair type:    Repair type:  Simple Pre-procedure details:    Preparation:  Patient was prepped and draped in usual sterile fashion Exploration:    Hemostasis achieved with:  Direct pressure   Wound exploration comment:  Wound explored with no FB noted   Contaminated: no   Treatment:    Area cleansed with:  Saline   Amount of cleaning:  Standard   Irrigation solution:  Sterile saline   Irrigation volume:  398ml   Irrigation method:  Syringe   Visualized foreign bodies/material removed: no   Skin repair:    Repair method:  Sutures   Suture size:  5-0   Suture material:  Prolene   Suture technique:  Simple interrupted   Number of sutures:  5 Approximation:    Approximation:  Close   Vermilion border: well-aligned   Post-procedure details:    Dressing: pressure dressing.   Patient tolerance of procedure:  Tolerated well, no immediate complications .Marland KitchenLaceration Repair Date/Time: 05/05/2017 6:59 PM Performed by: Rodney Booze, PA-C Authorized by: Rodney Booze, PA-C   Consent:    Consent obtained:  Verbal   Consent given by:  Patient   Risks discussed:  Infection and pain   Alternatives discussed:  No treatment Anesthesia (see MAR for exact dosages):    Anesthesia method:  Local infiltration   Local anesthetic:  Lidocaine 2% WITH epi Laceration details:    Location:  Face   Facial location: lateral to right upper eye.   Length (cm):  1 Pre-procedure details:   Preparation:  Patient was prepped and draped in usual sterile fashion Exploration:    Hemostasis achieved with:  Direct pressure   Wound exploration comment:  Wound explored and no FB   Contaminated: no   Treatment:    Area cleansed with:  Saline   Amount of cleaning:  Standard   Irrigation solution:  Sterile saline   Irrigation volume:  335ml   Irrigation method:  Syringe   Visualized foreign bodies/material removed: no   Skin repair:    Repair method:  Sutures   Suture size:  5-0   Suture material:  Prolene   Suture technique:  Simple interrupted   Number of sutures:  3 Approximation:    Approximation:  Close   Vermilion border: well-aligned   Post-procedure details:    Dressing: pressure dressing.   Patient tolerance of procedure:  Tolerated well, no immediate complications    Bishop Dublin 05/05/17 1901    Isla Pence, MD 05/05/17 2221

## 2017-05-14 ENCOUNTER — Emergency Department (HOSPITAL_COMMUNITY)
Admission: EM | Admit: 2017-05-14 | Discharge: 2017-05-14 | Disposition: A | Discharge status: Home / Self Care | Payer: Medicare Other | Attending: Emergency Medicine | Admitting: Emergency Medicine

## 2017-05-14 ENCOUNTER — Encounter (HOSPITAL_COMMUNITY): Payer: Self-pay | Admitting: Emergency Medicine

## 2017-05-14 DIAGNOSIS — S0083XD Contusion of other part of head, subsequent encounter: Secondary | ICD-10-CM | POA: Diagnosis not present

## 2017-05-14 DIAGNOSIS — Z7982 Long term (current) use of aspirin: Secondary | ICD-10-CM | POA: Diagnosis not present

## 2017-05-14 DIAGNOSIS — Z96653 Presence of artificial knee joint, bilateral: Secondary | ICD-10-CM | POA: Insufficient documentation

## 2017-05-14 DIAGNOSIS — X58XXXA Exposure to other specified factors, initial encounter: Secondary | ICD-10-CM | POA: Insufficient documentation

## 2017-05-14 DIAGNOSIS — Z87891 Personal history of nicotine dependence: Secondary | ICD-10-CM | POA: Insufficient documentation

## 2017-05-14 DIAGNOSIS — Z79899 Other long term (current) drug therapy: Secondary | ICD-10-CM | POA: Insufficient documentation

## 2017-05-14 DIAGNOSIS — Z4802 Encounter for removal of sutures: Secondary | ICD-10-CM | POA: Diagnosis not present

## 2017-05-14 DIAGNOSIS — I10 Essential (primary) hypertension: Secondary | ICD-10-CM | POA: Diagnosis not present

## 2017-05-14 NOTE — ED Notes (Signed)
Per PA request, saline soaked gauze applied to sutures prior to removal.

## 2017-05-14 NOTE — ED Provider Notes (Signed)
[facility] DEPT Provider Note   CSN: [id number] Arrival date & time: 05/14/17  1016     History   Chief Complaint Chief Complaint  Patient presents with  . Suture / Staple Removal    HPI   Blood pressure (!) 174/83, pulse 69, temperature (!) 97.5 F (36.4 C), temperature source Oral, resp. rate 18.  [name] is a 84 y.o. female resenting for suture removal to left temple area.  She had a mechanical fall approximately 1 week ago and sutures were placed here.  She has a large hematoma underlying the wound but she denies any headache, nausea or vomiting.  She is not anticoagulated B does take a daily low-dose aspirin at no warmth, tenderness to palpation or discharge.  She was applying ice for the first 48-72 hours but has DC'd that because it did not really help the swelling.  She denies any change in her vision, double vision, pain with eye movement.   Past Medical History:  Diagnosis Date  . Hypertension     Patient Active Problem List   Diagnosis Date Noted  . High blood pressure 01/15/2012    Past Surgical History:  Procedure Laterality Date  . APPENDECTOMY    . BREAST BIOPSY    . HERNIA REPAIR    . REPLACEMENT TOTAL KNEE BILATERAL      OB History    No data available       Home Medications    Prior to Admission medications   Medication Sig Start Date End Date Taking? Authorizing Provider  albuterol (PROVENTIL HFA;VENTOLIN HFA) 108 (90 BASE) MCG/ACT inhaler Inhale into the lungs every 6 (six) hours as needed for wheezing or shortness of breath.    [provider]  aspirin 81 MG tablet Take 81 mg by mouth daily.    [provider]  calcium carbonate (OS-CAL) 600 MG TABS Take 600 mg by mouth daily.    [provider]  cephALEXin (KEFLEX) 500 MG capsule Take 1 capsule (500 mg total) by mouth 2 (two) times daily. Patient not taking: Reported on 12/19/2014 06/02/14   Posey Boyer, MD  clindamycin  (CLEOCIN) 150 MG capsule Take by mouth 3 (three) times daily.    [provider]  fish oil-omega-3 fatty acids 1000 MG capsule Take 1 g by mouth daily.    [provider]  hydrochlorothiazide (MICROZIDE) 12.5 MG capsule Take 12.5 mg by mouth. Pt takes 3 times a week    [provider]  ibuprofen (ADVIL,MOTRIN) 200 MG tablet Take 200 mg by mouth every 6 (six) hours as needed.    [provider]  lisinopril (PRINIVIL,ZESTRIL) 10 MG tablet Take 10 mg by mouth daily.    [provider]  magnesium citrate SOLN Take 1 Bottle by mouth once.    [provider]  metoprolol succinate (TOPROL-XL) 25 MG 24 hr tablet Take 25 mg by mouth daily.    [provider]  nitroGLYCERIN (NITROSTAT) 0.4 MG SL tablet Place 0.4 mg under the tongue every 5 (five) minutes as needed for chest pain.    [provider]  Probiotic Product (PROBIOTIC DAILY PO) Take by mouth.    [provider]  simvastatin (ZOCOR) 20 MG tablet Take 20 mg by mouth daily.    [provider]  sulfamethoxazole-trimethoprim (BACTRIM DS,SEPTRA DS) 800-160 MG per tablet Take 1 tablet by mouth 2 (two) times daily. Patient not taking: Reported on 12/19/2014 05/22/14   Ruben Reason  H, MD  Thiamine HCl (VITAMIN B-1) 250 MG tablet Take 250 mg by mouth daily.    [provider]  vitamin C (ASCORBIC ACID) 500 MG tablet Take 500 mg by mouth daily.    [provider]    Family History Family History  Problem Relation Age of Onset  . Cancer Mother   . Stroke Father   . Hypertension Father     Social History Social History   Tobacco Use  . Smoking status: Former Research scientist (life sciences)  . Smokeless tobacco: Never Used  Substance Use Topics  . Alcohol use: No  . Drug use: No     Allergies   Amoxicillin and Penicillins   Review of Systems Review of Systems  A complete review of systems was obtained and all systems are negative except as noted in the HPI  and PMH.    Physical Exam Updated Vital Signs BP (!) 174/83 (BP Location: Right Arm)   Pulse 69   Temp (!) 97.5 F (36.4 C) (Oral)   Resp 18   Physical Exam  Constitutional: She is oriented to person, place, and time. She appears well-developed and well-nourished. No distress.  HENT:  Head: Normocephalic.  Large hematoma to right temple area with sutures in place, moderate scabbing in place.  No warmth, tenderness to palpation or discharge.  Eyes: Conjunctivae and EOM are normal.  Cardiovascular: Normal rate.  Pulmonary/Chest: Effort normal. No stridor.  Musculoskeletal: Normal range of motion.  Neurological: She is alert and oriented to person, place, and time.  Psychiatric: She has a normal mood and affect.  Nursing note and vitals reviewed.    ED Treatments / Results  Labs (all labs ordered are listed, but only abnormal results are displayed) Labs Reviewed - No data to display  EKG  EKG Interpretation None       Radiology No results found.  Procedures .Suture Removal Date/Time: 05/14/2017 3:08 PM Performed by: Monico Blitz, PA-C Authorized by: Monico Blitz, PA-C   Consent:    Consent obtained:  Verbal   Consent given by:  Patient Location:    Location:  [place] location:  Forehead Procedure details:    Wound appearance:  No signs of infection   Number of sutures removed:  5 Post-procedure details:    Patient tolerance of procedure:  Tolerated well, no immediate complications  Procedures .Suture Removal Date/Time: 05/14/2017 3:08 PM Performed by: Monico Blitz, PA-C Authorized by: Monico Blitz, PA-C   Consent:    Consent obtained:  Verbal   Consent given by:  Patient Location:    Location:  [place] location:  Forehead Procedure details:    Wound appearance:  No signs of infection   Number of sutures removed:  3 Post-procedure details:    Patient tolerance of procedure:  Tolerated well, no immediate  complications   Medications Ordered in ED Medications - No data to display   Initial Impression / Assessment and Plan / ED Course  I have reviewed the triage vital signs and the nursing notes.  Pertinent labs & imaging results that were available during my care of the patient were reviewed by me and considered in my medical decision making (see chart for details).     Vitals:   05/14/17 1048  BP: (!) 174/83  Pulse: 69  Resp: 18  Temp: (!) 97.5 F (36.4 C)  TempSrc: Oral    [name] is 84 y.o. female presenting for suture removal, she has a large  underlying hematoma but no signs of infection.  Wound is cleaned and sutures are removed.  Evaluation does not show pathology that would require ongoing emergent intervention or inpatient treatment. Pt is hemodynamically stable and mentating appropriately. Discussed findings and plan with patient/guardian, who agrees with care plan. All questions answered. Return precautions discussed and outpatient follow up given.      Final Clinical Impressions(s) / ED Diagnoses   Final diagnoses:  Visit for suture removal    ED Discharge Orders    None       Waynetta Pean 05/14/17 1509    Blanchie Dessert, MD 05/16/17 [phone]

## 2017-05-14 NOTE — Discharge Instructions (Signed)
Please follow with your primary care doctor in the next 2 days for a check-up. They must obtain records for further management.  ° °Do not hesitate to return to the Emergency Department for any new, worsening or concerning symptoms.  ° °

## 2017-05-14 NOTE — ED Notes (Signed)
Bed: WTR8 Expected date:  Expected time:  Means of arrival:  Comments: 

## 2017-05-14 NOTE — ED Triage Notes (Signed)
Patient reports fell week ago and got sutures in above right eye and here to get removed. Patient still has swelling around right eye.

## 2017-07-12 DIAGNOSIS — J300 Vasomotor rhinitis: Secondary | ICD-10-CM | POA: Diagnosis not present

## 2017-07-12 DIAGNOSIS — H6993 Unspecified Eustachian tube disorder, bilateral: Secondary | ICD-10-CM | POA: Diagnosis not present

## 2017-07-12 DIAGNOSIS — H6523 Chronic serous otitis media, bilateral: Secondary | ICD-10-CM | POA: Diagnosis not present

## 2017-07-24 DIAGNOSIS — M1990 Unspecified osteoarthritis, unspecified site: Secondary | ICD-10-CM | POA: Diagnosis not present

## 2017-07-24 DIAGNOSIS — Z7952 Long term (current) use of systemic steroids: Secondary | ICD-10-CM | POA: Diagnosis not present

## 2017-07-24 DIAGNOSIS — M064 Inflammatory polyarthropathy: Secondary | ICD-10-CM | POA: Diagnosis not present

## 2017-07-24 DIAGNOSIS — M79643 Pain in unspecified hand: Secondary | ICD-10-CM | POA: Diagnosis not present

## 2017-08-15 DIAGNOSIS — E7800 Pure hypercholesterolemia, unspecified: Secondary | ICD-10-CM | POA: Diagnosis not present

## 2017-08-15 DIAGNOSIS — I10 Essential (primary) hypertension: Secondary | ICD-10-CM | POA: Diagnosis not present

## 2017-08-15 DIAGNOSIS — N390 Urinary tract infection, site not specified: Secondary | ICD-10-CM | POA: Diagnosis not present

## 2017-08-22 DIAGNOSIS — Z0000 Encounter for general adult medical examination without abnormal findings: Secondary | ICD-10-CM | POA: Diagnosis not present

## 2017-08-22 DIAGNOSIS — E7800 Pure hypercholesterolemia, unspecified: Secondary | ICD-10-CM | POA: Diagnosis not present

## 2017-08-22 DIAGNOSIS — L309 Dermatitis, unspecified: Secondary | ICD-10-CM | POA: Diagnosis not present

## 2017-08-22 DIAGNOSIS — I10 Essential (primary) hypertension: Secondary | ICD-10-CM | POA: Diagnosis not present

## 2017-08-22 DIAGNOSIS — E02 Subclinical iodine-deficiency hypothyroidism: Secondary | ICD-10-CM | POA: Diagnosis not present

## 2017-08-22 DIAGNOSIS — R0600 Dyspnea, unspecified: Secondary | ICD-10-CM | POA: Diagnosis not present

## 2017-09-27 DIAGNOSIS — Z79899 Other long term (current) drug therapy: Secondary | ICD-10-CM | POA: Diagnosis not present

## 2017-09-27 DIAGNOSIS — H52203 Unspecified astigmatism, bilateral: Secondary | ICD-10-CM | POA: Diagnosis not present

## 2017-09-27 DIAGNOSIS — H43813 Vitreous degeneration, bilateral: Secondary | ICD-10-CM | POA: Diagnosis not present

## 2017-09-27 DIAGNOSIS — Z961 Presence of intraocular lens: Secondary | ICD-10-CM | POA: Diagnosis not present

## 2017-10-04 DIAGNOSIS — B379 Candidiasis, unspecified: Secondary | ICD-10-CM | POA: Diagnosis not present

## 2017-10-10 DIAGNOSIS — R300 Dysuria: Secondary | ICD-10-CM | POA: Diagnosis not present

## 2017-10-10 DIAGNOSIS — N390 Urinary tract infection, site not specified: Secondary | ICD-10-CM | POA: Diagnosis not present

## 2017-12-28 DIAGNOSIS — Z23 Encounter for immunization: Secondary | ICD-10-CM | POA: Diagnosis not present

## 2018-01-02 DIAGNOSIS — I10 Essential (primary) hypertension: Secondary | ICD-10-CM | POA: Diagnosis not present

## 2018-02-07 DIAGNOSIS — Z7952 Long term (current) use of systemic steroids: Secondary | ICD-10-CM | POA: Diagnosis not present

## 2018-02-07 DIAGNOSIS — M064 Inflammatory polyarthropathy: Secondary | ICD-10-CM | POA: Diagnosis not present

## 2018-02-07 DIAGNOSIS — M1990 Unspecified osteoarthritis, unspecified site: Secondary | ICD-10-CM | POA: Diagnosis not present

## 2018-02-07 DIAGNOSIS — M79643 Pain in unspecified hand: Secondary | ICD-10-CM | POA: Diagnosis not present

## 2018-02-15 DIAGNOSIS — E02 Subclinical iodine-deficiency hypothyroidism: Secondary | ICD-10-CM | POA: Diagnosis not present

## 2018-02-22 DIAGNOSIS — E02 Subclinical iodine-deficiency hypothyroidism: Secondary | ICD-10-CM | POA: Diagnosis not present

## 2018-02-22 DIAGNOSIS — M810 Age-related osteoporosis without current pathological fracture: Secondary | ICD-10-CM | POA: Diagnosis not present

## 2018-02-22 DIAGNOSIS — E7800 Pure hypercholesterolemia, unspecified: Secondary | ICD-10-CM | POA: Diagnosis not present

## 2018-02-22 DIAGNOSIS — N390 Urinary tract infection, site not specified: Secondary | ICD-10-CM | POA: Diagnosis not present

## 2018-02-22 DIAGNOSIS — R300 Dysuria: Secondary | ICD-10-CM | POA: Diagnosis not present

## 2018-02-22 DIAGNOSIS — E559 Vitamin D deficiency, unspecified: Secondary | ICD-10-CM | POA: Diagnosis not present

## 2018-02-22 DIAGNOSIS — I10 Essential (primary) hypertension: Secondary | ICD-10-CM | POA: Diagnosis not present

## 2018-04-01 DIAGNOSIS — R296 Repeated falls: Secondary | ICD-10-CM | POA: Diagnosis not present

## 2018-04-01 DIAGNOSIS — M545 Low back pain: Secondary | ICD-10-CM | POA: Diagnosis not present

## 2018-04-01 DIAGNOSIS — W19XXXA Unspecified fall, initial encounter: Secondary | ICD-10-CM | POA: Diagnosis not present

## 2018-04-11 DIAGNOSIS — W19XXXA Unspecified fall, initial encounter: Secondary | ICD-10-CM | POA: Diagnosis not present

## 2018-04-11 DIAGNOSIS — M545 Low back pain: Secondary | ICD-10-CM | POA: Diagnosis not present

## 2018-04-11 DIAGNOSIS — I700 Atherosclerosis of aorta: Secondary | ICD-10-CM | POA: Diagnosis not present

## 2018-04-15 DIAGNOSIS — M545 Low back pain: Secondary | ICD-10-CM | POA: Diagnosis not present

## 2018-04-15 DIAGNOSIS — M6281 Muscle weakness (generalized): Secondary | ICD-10-CM | POA: Diagnosis not present

## 2018-04-15 DIAGNOSIS — R262 Difficulty in walking, not elsewhere classified: Secondary | ICD-10-CM | POA: Diagnosis not present

## 2018-04-16 DIAGNOSIS — M545 Low back pain: Secondary | ICD-10-CM | POA: Diagnosis not present

## 2018-04-16 DIAGNOSIS — R262 Difficulty in walking, not elsewhere classified: Secondary | ICD-10-CM | POA: Diagnosis not present

## 2018-04-16 DIAGNOSIS — M6281 Muscle weakness (generalized): Secondary | ICD-10-CM | POA: Diagnosis not present

## 2018-04-18 DIAGNOSIS — R262 Difficulty in walking, not elsewhere classified: Secondary | ICD-10-CM | POA: Diagnosis not present

## 2018-04-18 DIAGNOSIS — M6281 Muscle weakness (generalized): Secondary | ICD-10-CM | POA: Diagnosis not present

## 2018-04-18 DIAGNOSIS — M545 Low back pain: Secondary | ICD-10-CM | POA: Diagnosis not present

## 2018-04-22 DIAGNOSIS — M6281 Muscle weakness (generalized): Secondary | ICD-10-CM | POA: Diagnosis not present

## 2018-04-22 DIAGNOSIS — R262 Difficulty in walking, not elsewhere classified: Secondary | ICD-10-CM | POA: Diagnosis not present

## 2018-04-22 DIAGNOSIS — M545 Low back pain: Secondary | ICD-10-CM | POA: Diagnosis not present

## 2018-04-23 ENCOUNTER — Ambulatory Visit (INDEPENDENT_AMBULATORY_CARE_PROVIDER_SITE_OTHER): Payer: Self-pay | Admitting: Orthopaedic Surgery

## 2018-04-23 DIAGNOSIS — M545 Low back pain: Secondary | ICD-10-CM | POA: Diagnosis not present

## 2018-04-23 DIAGNOSIS — R262 Difficulty in walking, not elsewhere classified: Secondary | ICD-10-CM | POA: Diagnosis not present

## 2018-04-23 DIAGNOSIS — M6281 Muscle weakness (generalized): Secondary | ICD-10-CM | POA: Diagnosis not present

## 2018-04-25 DIAGNOSIS — R262 Difficulty in walking, not elsewhere classified: Secondary | ICD-10-CM | POA: Diagnosis not present

## 2018-04-25 DIAGNOSIS — M545 Low back pain: Secondary | ICD-10-CM | POA: Diagnosis not present

## 2018-04-25 DIAGNOSIS — M6281 Muscle weakness (generalized): Secondary | ICD-10-CM | POA: Diagnosis not present

## 2018-04-29 DIAGNOSIS — M6281 Muscle weakness (generalized): Secondary | ICD-10-CM | POA: Diagnosis not present

## 2018-04-29 DIAGNOSIS — M545 Low back pain: Secondary | ICD-10-CM | POA: Diagnosis not present

## 2018-04-29 DIAGNOSIS — R262 Difficulty in walking, not elsewhere classified: Secondary | ICD-10-CM | POA: Diagnosis not present

## 2018-04-30 DIAGNOSIS — R262 Difficulty in walking, not elsewhere classified: Secondary | ICD-10-CM | POA: Diagnosis not present

## 2018-04-30 DIAGNOSIS — M6281 Muscle weakness (generalized): Secondary | ICD-10-CM | POA: Diagnosis not present

## 2018-04-30 DIAGNOSIS — M545 Low back pain: Secondary | ICD-10-CM | POA: Diagnosis not present

## 2018-05-03 DIAGNOSIS — M545 Low back pain: Secondary | ICD-10-CM | POA: Diagnosis not present

## 2018-05-03 DIAGNOSIS — R262 Difficulty in walking, not elsewhere classified: Secondary | ICD-10-CM | POA: Diagnosis not present

## 2018-05-03 DIAGNOSIS — M6281 Muscle weakness (generalized): Secondary | ICD-10-CM | POA: Diagnosis not present

## 2018-05-30 DIAGNOSIS — M1990 Unspecified osteoarthritis, unspecified site: Secondary | ICD-10-CM | POA: Diagnosis not present

## 2018-05-30 DIAGNOSIS — Z7952 Long term (current) use of systemic steroids: Secondary | ICD-10-CM | POA: Diagnosis not present

## 2018-05-30 DIAGNOSIS — M79643 Pain in unspecified hand: Secondary | ICD-10-CM | POA: Diagnosis not present

## 2018-05-30 DIAGNOSIS — M064 Inflammatory polyarthropathy: Secondary | ICD-10-CM | POA: Diagnosis not present

## 2018-05-30 DIAGNOSIS — B370 Candidal stomatitis: Secondary | ICD-10-CM | POA: Diagnosis not present

## 2018-07-26 DIAGNOSIS — M7989 Other specified soft tissue disorders: Secondary | ICD-10-CM | POA: Diagnosis not present

## 2018-08-06 DIAGNOSIS — R21 Rash and other nonspecific skin eruption: Secondary | ICD-10-CM | POA: Diagnosis not present

## 2018-09-30 DIAGNOSIS — Z7189 Other specified counseling: Secondary | ICD-10-CM | POA: Diagnosis not present

## 2018-09-30 DIAGNOSIS — L0390 Cellulitis, unspecified: Secondary | ICD-10-CM | POA: Diagnosis not present

## 2018-09-30 DIAGNOSIS — S81801A Unspecified open wound, right lower leg, initial encounter: Secondary | ICD-10-CM | POA: Diagnosis not present

## 2018-09-30 DIAGNOSIS — I10 Essential (primary) hypertension: Secondary | ICD-10-CM | POA: Diagnosis not present

## 2018-10-02 DIAGNOSIS — H04123 Dry eye syndrome of bilateral lacrimal glands: Secondary | ICD-10-CM | POA: Diagnosis not present

## 2018-10-02 DIAGNOSIS — H52203 Unspecified astigmatism, bilateral: Secondary | ICD-10-CM | POA: Diagnosis not present

## 2018-10-02 DIAGNOSIS — Z79899 Other long term (current) drug therapy: Secondary | ICD-10-CM | POA: Diagnosis not present

## 2018-10-02 DIAGNOSIS — T1511XA Foreign body in conjunctival sac, right eye, initial encounter: Secondary | ICD-10-CM | POA: Diagnosis not present

## 2018-10-28 IMAGING — CT CT CERVICAL SPINE W/O CM
4 of 10 series · 8 of 33 positions shown, 9 images · non-contrast
Comparison: CT head 09/30/2013

CLINICAL DATA: Fell today, stumbled on concrete, large LEFT
supraorbital hematoma and small laceration

EXAM:
CT HEAD WITHOUT CONTRAST
CT MAXILLOFACIAL WITHOUT CONTRAST
CT CERVICAL SPINE WITHOUT CONTRAST
TECHNIQUE: Multidetector CT imaging of the head, cervical spine, and
maxillofacial structures were performed using the standard protocol
without intravenous contrast. Multiplanar CT image reconstructions
of the cervical spine and maxillofacial structures were also
generated.

[Series 11: orthogonal axials · axial · 0.23mm/px · z∈[+1248,+1336]mm · 3 of 99 slices shown, 4 images]
[im 25/99  soft-tissue]
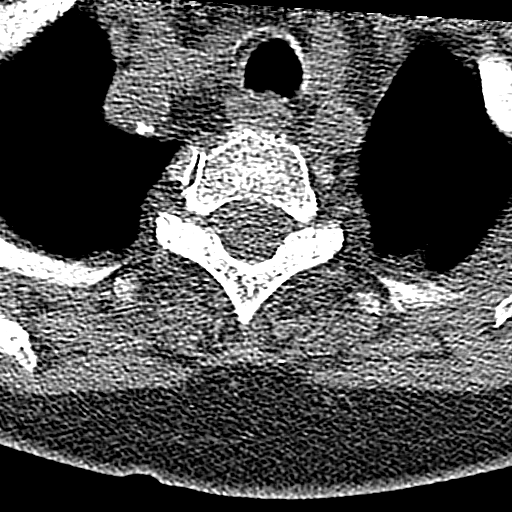
[im 25/99  bone]
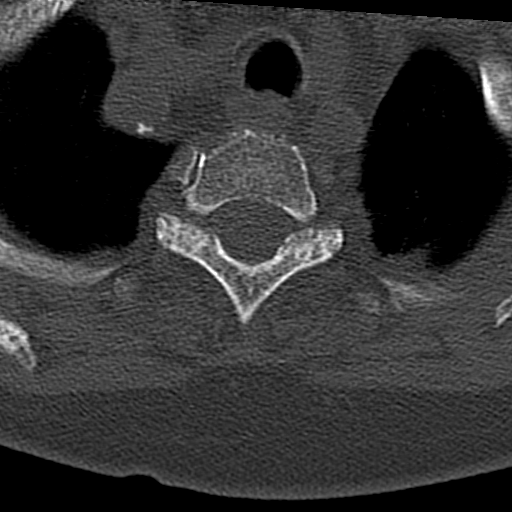
[im 50/99  bone]
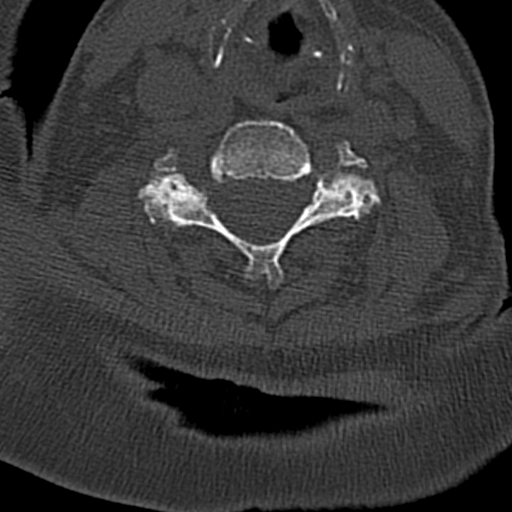
[im 74/99  bone]
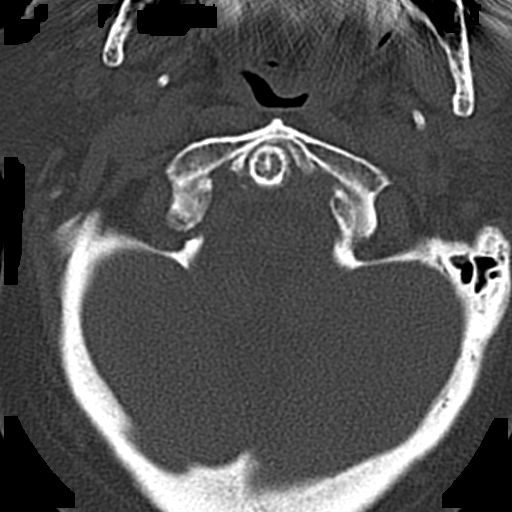

[Series 13: sagittal bone · sagittal · 0.23mm/px · 2 of 61 slices shown]
[im 21/61  bone]
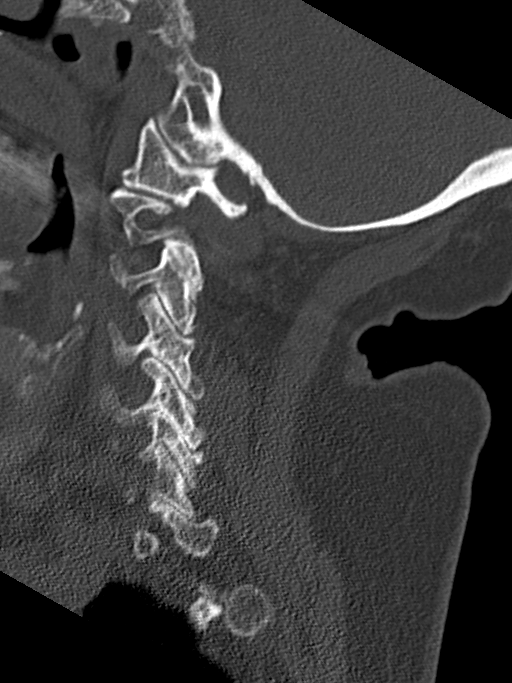
[im 41/61  bone]
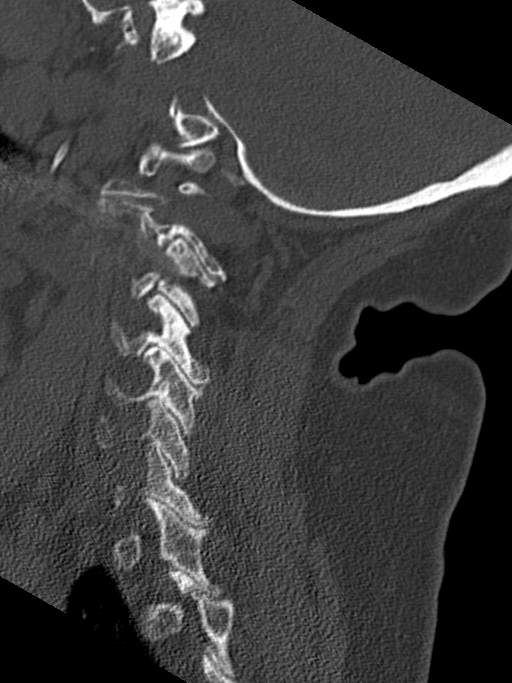

[Series 14: max soft · axial · 0.39mm/px · z∈[+1329,+1391]mm · 2 of 93 slices shown]
[im 31/93  soft-tissue]
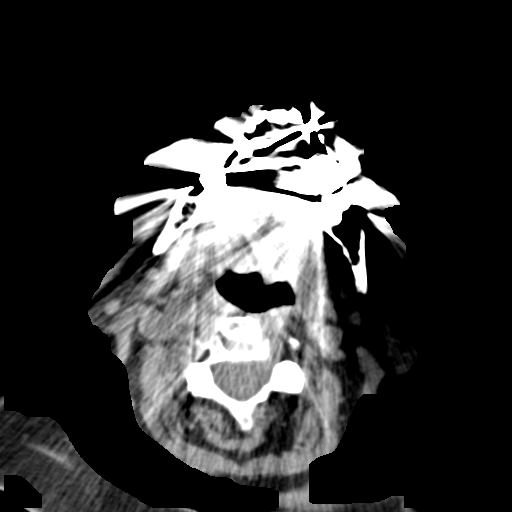
[im 62/93  soft-tissue]
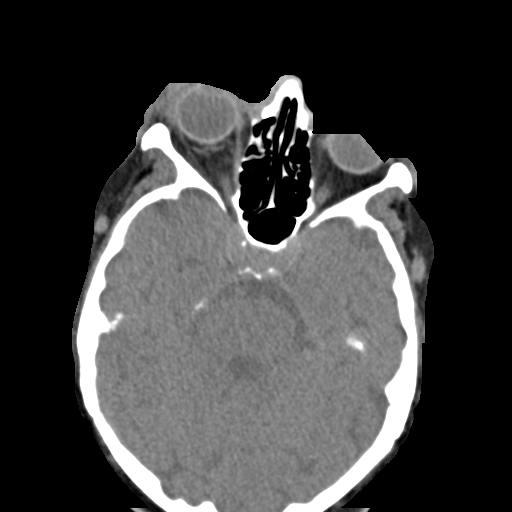

[Series 18: coronal soft · coronal · 0.36mm/px · 1 of 86 slices shown]
[im 43/86  bone]
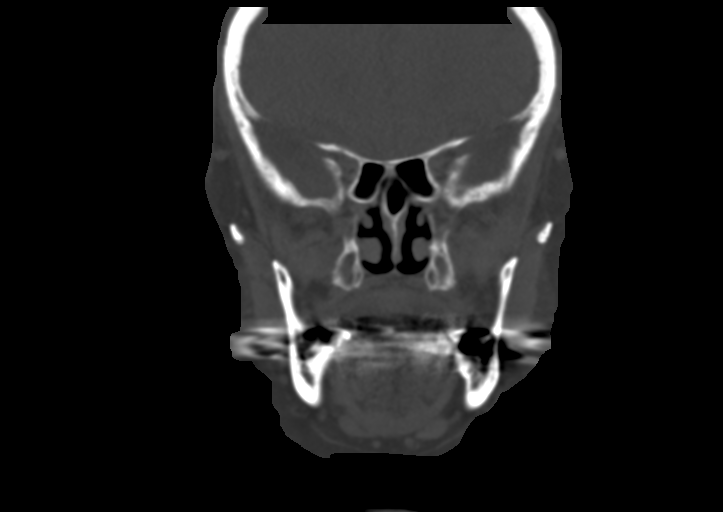

[8 of 33 positions shown; findings below may reference images not displayed]

FINDINGS: CT HEAD FINDINGS

Brain: Generalized atrophy. Normal ventricular morphology. No
midline shift or mass effect. Small vessel chronic ischemic changes
of deep cerebral white matter. No intracranial hemorrhage, mass
lesion, evidence of acute infarction, or extra-axial fluid
collection.

Vascular: Unremarkable

Skull: Intact. Large RIGHT frontal scalp hematoma extending RIGHT
periorbital

Other: N/A

CT MAXILLOFACIAL FINDINGS

Osseous: Osseous mineralization normal. Nasal septal deviation to
the LEFT. Calvaria intact. Zygomas, orbits and sinuses intact. TMJ
degenerative changes without dislocation. No acute facial bone
abnormalities identified. Scattered beam hardening artifacts of
dental origin.

Orbits: Intraorbital soft tissue planes clear.

Sinuses: Minimal mucosal thickening in the maxillary sinuses
bilaterally. Remaining paranasal sinuses, mastoid air cells and
middle ear cavities clear

Soft tissues: Large RIGHT periorbital hematoma extending into RIGHT
frontal region and overlying RIGHT zygoma. Soft tissue swelling
extends the RIGHT infraorbital into the maxillary region. No
additional regional soft tissue abnormalities. Few scattered normal
sized anterior cervical lymph nodes.

CT CERVICAL SPINE FINDINGS

Alignment: Normal

Skull base and vertebrae: Visualized skull base intact. Diffuse
osseous demineralization. Disc space narrowing with endplate spur
formation at C5-C6 to C7-T1. Multilevel facet degenerative changes.
Vertebral body heights maintained without fracture or subluxation.

Soft tissues and spinal canal: Prevertebral soft tissues normal
thickness. Remaining cervical soft tissues unremarkable.

Disc levels:  No additional abnormalities

Upper chest: Lung apices clear

Other: Atherosclerotic calcifications at the carotid bifurcations
bilaterally.
IMPRESSION: Atrophy with small vessel chronic ischemic changes of deep cerebral
white matter.

No acute intracranial abnormalities.

Multilevel degenerative disc and facet disease changes of the
cervical spine.

No acute cervical spine abnormalities.

Large RIGHT frontal and periorbital scalp hematoma extending to the
lateral RIGHT face and to the RIGHT maxillary region.

No acute facial bone abnormalities.

## 2018-11-06 DIAGNOSIS — A499 Bacterial infection, unspecified: Secondary | ICD-10-CM | POA: Diagnosis not present

## 2018-11-06 DIAGNOSIS — N390 Urinary tract infection, site not specified: Secondary | ICD-10-CM | POA: Diagnosis not present

## 2018-11-06 DIAGNOSIS — R300 Dysuria: Secondary | ICD-10-CM | POA: Diagnosis not present

## 2018-11-12 DIAGNOSIS — Z9181 History of falling: Secondary | ICD-10-CM | POA: Diagnosis not present

## 2018-11-12 DIAGNOSIS — R2689 Other abnormalities of gait and mobility: Secondary | ICD-10-CM | POA: Diagnosis not present

## 2018-11-13 DIAGNOSIS — Z9181 History of falling: Secondary | ICD-10-CM | POA: Diagnosis not present

## 2018-11-13 DIAGNOSIS — R2689 Other abnormalities of gait and mobility: Secondary | ICD-10-CM | POA: Diagnosis not present

## 2018-11-13 DIAGNOSIS — E669 Obesity, unspecified: Secondary | ICD-10-CM | POA: Diagnosis not present

## 2018-11-13 DIAGNOSIS — A499 Bacterial infection, unspecified: Secondary | ICD-10-CM | POA: Diagnosis not present

## 2018-11-13 DIAGNOSIS — R3129 Other microscopic hematuria: Secondary | ICD-10-CM | POA: Diagnosis not present

## 2018-11-13 DIAGNOSIS — N390 Urinary tract infection, site not specified: Secondary | ICD-10-CM | POA: Diagnosis not present

## 2018-11-20 DIAGNOSIS — R2689 Other abnormalities of gait and mobility: Secondary | ICD-10-CM | POA: Diagnosis not present

## 2018-11-20 DIAGNOSIS — Z9181 History of falling: Secondary | ICD-10-CM | POA: Diagnosis not present

## 2018-11-22 DIAGNOSIS — R2689 Other abnormalities of gait and mobility: Secondary | ICD-10-CM | POA: Diagnosis not present

## 2018-11-22 DIAGNOSIS — Z9181 History of falling: Secondary | ICD-10-CM | POA: Diagnosis not present

## 2018-11-24 DIAGNOSIS — N390 Urinary tract infection, site not specified: Secondary | ICD-10-CM | POA: Diagnosis not present

## 2018-11-24 DIAGNOSIS — R3129 Other microscopic hematuria: Secondary | ICD-10-CM | POA: Diagnosis not present

## 2018-11-24 DIAGNOSIS — Z79899 Other long term (current) drug therapy: Secondary | ICD-10-CM | POA: Diagnosis not present

## 2018-11-24 DIAGNOSIS — A499 Bacterial infection, unspecified: Secondary | ICD-10-CM | POA: Diagnosis not present

## 2018-11-24 DIAGNOSIS — Z1159 Encounter for screening for other viral diseases: Secondary | ICD-10-CM | POA: Diagnosis not present

## 2018-11-28 DIAGNOSIS — M810 Age-related osteoporosis without current pathological fracture: Secondary | ICD-10-CM | POA: Diagnosis not present

## 2018-11-28 DIAGNOSIS — M549 Dorsalgia, unspecified: Secondary | ICD-10-CM | POA: Diagnosis not present

## 2018-11-28 DIAGNOSIS — Z7952 Long term (current) use of systemic steroids: Secondary | ICD-10-CM | POA: Diagnosis not present

## 2018-11-28 DIAGNOSIS — E785 Hyperlipidemia, unspecified: Secondary | ICD-10-CM | POA: Diagnosis not present

## 2018-11-28 DIAGNOSIS — M1990 Unspecified osteoarthritis, unspecified site: Secondary | ICD-10-CM | POA: Diagnosis not present

## 2018-11-28 DIAGNOSIS — M0600 Rheumatoid arthritis without rheumatoid factor, unspecified site: Secondary | ICD-10-CM | POA: Diagnosis not present

## 2018-11-28 DIAGNOSIS — E039 Hypothyroidism, unspecified: Secondary | ICD-10-CM | POA: Diagnosis not present

## 2018-12-12 DIAGNOSIS — N281 Cyst of kidney, acquired: Secondary | ICD-10-CM | POA: Diagnosis not present

## 2018-12-12 DIAGNOSIS — N3000 Acute cystitis without hematuria: Secondary | ICD-10-CM | POA: Diagnosis not present

## 2018-12-12 DIAGNOSIS — R1084 Generalized abdominal pain: Secondary | ICD-10-CM | POA: Diagnosis not present

## 2018-12-18 DIAGNOSIS — Z03818 Encounter for observation for suspected exposure to other biological agents ruled out: Secondary | ICD-10-CM | POA: Diagnosis not present

## 2019-01-20 DIAGNOSIS — M5441 Lumbago with sciatica, right side: Secondary | ICD-10-CM | POA: Diagnosis not present

## 2019-01-20 DIAGNOSIS — S3993XA Unspecified injury of pelvis, initial encounter: Secondary | ICD-10-CM | POA: Diagnosis not present

## 2019-01-20 DIAGNOSIS — W19XXXA Unspecified fall, initial encounter: Secondary | ICD-10-CM | POA: Diagnosis not present

## 2019-01-20 DIAGNOSIS — M5442 Lumbago with sciatica, left side: Secondary | ICD-10-CM | POA: Diagnosis not present

## 2019-01-20 DIAGNOSIS — S3991XA Unspecified injury of abdomen, initial encounter: Secondary | ICD-10-CM | POA: Diagnosis not present

## 2019-01-20 DIAGNOSIS — S3992XA Unspecified injury of lower back, initial encounter: Secondary | ICD-10-CM | POA: Diagnosis not present

## 2019-01-21 DIAGNOSIS — Z23 Encounter for immunization: Secondary | ICD-10-CM | POA: Diagnosis not present

## 2019-01-30 DIAGNOSIS — M5432 Sciatica, left side: Secondary | ICD-10-CM | POA: Diagnosis not present

## 2019-01-30 DIAGNOSIS — R2689 Other abnormalities of gait and mobility: Secondary | ICD-10-CM | POA: Diagnosis not present

## 2019-01-30 DIAGNOSIS — M6281 Muscle weakness (generalized): Secondary | ICD-10-CM | POA: Diagnosis not present

## 2019-02-04 DIAGNOSIS — M5432 Sciatica, left side: Secondary | ICD-10-CM | POA: Diagnosis not present

## 2019-02-04 DIAGNOSIS — R2689 Other abnormalities of gait and mobility: Secondary | ICD-10-CM | POA: Diagnosis not present

## 2019-02-04 DIAGNOSIS — M6281 Muscle weakness (generalized): Secondary | ICD-10-CM | POA: Diagnosis not present

## 2019-02-06 DIAGNOSIS — M5432 Sciatica, left side: Secondary | ICD-10-CM | POA: Diagnosis not present

## 2019-02-06 DIAGNOSIS — R2689 Other abnormalities of gait and mobility: Secondary | ICD-10-CM | POA: Diagnosis not present

## 2019-02-06 DIAGNOSIS — M6281 Muscle weakness (generalized): Secondary | ICD-10-CM | POA: Diagnosis not present

## 2019-02-11 DIAGNOSIS — R519 Headache, unspecified: Secondary | ICD-10-CM | POA: Diagnosis not present

## 2019-02-11 DIAGNOSIS — K921 Melena: Secondary | ICD-10-CM | POA: Diagnosis not present

## 2019-02-11 DIAGNOSIS — R110 Nausea: Secondary | ICD-10-CM | POA: Diagnosis not present

## 2019-02-11 DIAGNOSIS — D649 Anemia, unspecified: Secondary | ICD-10-CM | POA: Diagnosis not present

## 2019-02-12 ENCOUNTER — Inpatient Hospital Stay (HOSPITAL_COMMUNITY)
Admission: AD | Admit: 2019-02-12 | Discharge: 2019-02-15 | DRG: 379 | Disposition: A | Discharge status: Home Health | Payer: Medicare Other | Source: Skilled Nursing Facility | Attending: Gastroenterology | Admitting: Gastroenterology

## 2019-02-12 DIAGNOSIS — Z111 Encounter for screening for respiratory tuberculosis: Secondary | ICD-10-CM | POA: Diagnosis not present

## 2019-02-12 DIAGNOSIS — Z96653 Presence of artificial knee joint, bilateral: Secondary | ICD-10-CM | POA: Diagnosis present

## 2019-02-12 DIAGNOSIS — R5383 Other fatigue: Secondary | ICD-10-CM | POA: Diagnosis not present

## 2019-02-12 DIAGNOSIS — I959 Hypotension, unspecified: Secondary | ICD-10-CM | POA: Diagnosis present

## 2019-02-12 DIAGNOSIS — R05 Cough: Secondary | ICD-10-CM | POA: Diagnosis not present

## 2019-02-12 DIAGNOSIS — D509 Iron deficiency anemia, unspecified: Secondary | ICD-10-CM | POA: Diagnosis present

## 2019-02-12 DIAGNOSIS — M1990 Unspecified osteoarthritis, unspecified site: Secondary | ICD-10-CM | POA: Diagnosis not present

## 2019-02-12 DIAGNOSIS — K922 Gastrointestinal hemorrhage, unspecified: Secondary | ICD-10-CM | POA: Diagnosis not present

## 2019-02-12 DIAGNOSIS — Z20828 Contact with and (suspected) exposure to other viral communicable diseases: Secondary | ICD-10-CM | POA: Diagnosis present

## 2019-02-12 DIAGNOSIS — I10 Essential (primary) hypertension: Secondary | ICD-10-CM | POA: Diagnosis present

## 2019-02-12 DIAGNOSIS — K219 Gastro-esophageal reflux disease without esophagitis: Secondary | ICD-10-CM | POA: Diagnosis not present

## 2019-02-12 DIAGNOSIS — Z7952 Long term (current) use of systemic steroids: Secondary | ICD-10-CM | POA: Diagnosis not present

## 2019-02-12 DIAGNOSIS — D649 Anemia, unspecified: Secondary | ICD-10-CM | POA: Diagnosis not present

## 2019-02-12 DIAGNOSIS — Z87891 Personal history of nicotine dependence: Secondary | ICD-10-CM

## 2019-02-12 DIAGNOSIS — T39395A Adverse effect of other nonsteroidal anti-inflammatory drugs [NSAID], initial encounter: Secondary | ICD-10-CM | POA: Diagnosis present

## 2019-02-12 DIAGNOSIS — K921 Melena: Secondary | ICD-10-CM | POA: Diagnosis present

## 2019-02-12 DIAGNOSIS — R2689 Other abnormalities of gait and mobility: Secondary | ICD-10-CM | POA: Diagnosis not present

## 2019-02-12 DIAGNOSIS — M6281 Muscle weakness (generalized): Secondary | ICD-10-CM | POA: Diagnosis not present

## 2019-02-12 LAB — COMPREHENSIVE METABOLIC PANEL
ALT: 30 U/L (ref 0–44)
AST: 33 U/L (ref 15–41)
Albumin: 3 g/dL — ABNORMAL LOW (ref 3.5–5.0)
Alkaline Phosphatase: 147 U/L — ABNORMAL HIGH (ref 38–126)
Anion gap: 9 (ref 5–15)
BUN: 59 mg/dL — ABNORMAL HIGH (ref 8–23)
CO2: 22 mmol/L (ref 22–32)
Calcium: 8.3 mg/dL — ABNORMAL LOW (ref 8.9–10.3)
Chloride: 110 mmol/L (ref 98–111)
Creatinine, Ser: 0.62 mg/dL (ref 0.44–1.00)
GFR calc Af Amer: >60 mL/min (ref >60)
GFR calc non Af Amer: >60 mL/min (ref >60)
Glucose, Bld: 117 mg/dL — ABNORMAL HIGH (ref 70–99)
Potassium: 4.3 mmol/L (ref 3.5–5.1)
Sodium: 141 mmol/L (ref 135–145)
Total Bilirubin: 0.8 mg/dL (ref 0.3–1.2)
Total Protein: 5.3 g/dL — ABNORMAL LOW (ref 6.5–8.1)

## 2019-02-12 LAB — CBC
HCT: 25.9 % — ABNORMAL LOW (ref 36.0–46.0)
Hemoglobin: 8.2 g/dL — ABNORMAL LOW (ref 12.0–15.0)
MCH: 33.1 pg (ref 26.0–34.0)
MCHC: 31.7 g/dL (ref 30.0–36.0)
MCV: 104.4 fL — ABNORMAL HIGH (ref 80.0–100.0)
Platelets: 390 10*3/uL (ref 150–400)
RBC: 2.48 MIL/uL — ABNORMAL LOW (ref 3.87–5.11)
RDW: 22.5 % — ABNORMAL HIGH (ref 11.5–15.5)
WBC: 13.9 10*3/uL — ABNORMAL HIGH (ref 4.0–10.5)
nRBC: 1.2 % — ABNORMAL HIGH (ref 0.0–0.2)

## 2019-02-12 LAB — MRSA PCR SCREENING: MRSA by PCR: NEGATIVE

## 2019-02-12 MED ORDER — SODIUM CHLORIDE 0.9 % IV SOLN
INTRAVENOUS | Status: DC
  Administered 2019-02-12: 19:00:00 via INTRAVENOUS

## 2019-02-12 MED ORDER — SODIUM CHLORIDE 0.9% FLUSH
3.0000 mL | Freq: Two times a day (BID) | INTRAVENOUS | Status: DC
  Administered 2019-02-12 – 2019-02-14 (×3): 3 mL via INTRAVENOUS

## 2019-02-12 MED ORDER — PANTOPRAZOLE SODIUM 40 MG PO TBEC
40.0000 mg | DELAYED_RELEASE_TABLET | Freq: Two times a day (BID) | ORAL | Status: DC
  Administered 2019-02-12 – 2019-02-15 (×6): 40 mg via ORAL
  Filled 2019-02-12 (×6): qty 1

## 2019-02-12 NOTE — H&P (Signed)
[name] HPI: This is a 85 year old female with a PMH of HTN admitted to the hospital for a GI bleed secondary to NSAIDs.  She was evaluated in the office a couple of hours ago and she was brought from her nursing home for complaints of melena.  She was initially seen by her PCP yesterday for fatigue, nausea, and melenic stools.  Her HGB was noted to be at 10.2 g/dL at her PCP's office.  Her symptoms started this past weekend with nausea, weakness, and some epigastric tenderness.  The patient was started on omeprazole 20 mg BID and her upper GI symptoms resolved, but she had a bout of melenic stool before her office visit today.  Her daughter states that she is very fatigued and she appears pale.  Her blood pressure was noted to be 80/40 mmHg with a manual pressure, but her daughter reports to me that her blood pressure is currently at 111 SBP with the automated pressure cuff in the hospital.  One month ago she was instructed to use Aleve QID for her severe arthritis complaints, but she stopped that medication with the onset of the melena.  Past Medical History:  Diagnosis Date  . Hypertension     Past Surgical History:  Procedure Laterality Date  . APPENDECTOMY    . BREAST BIOPSY    . HERNIA REPAIR    . REPLACEMENT TOTAL KNEE BILATERAL      Family History  Problem Relation Age of Onset  . Cancer Mother   . Stroke Father   . Hypertension Father     Social History:  reports that she has quit smoking. She has never used smokeless tobacco. She reports that she does not drink alcohol or use drugs.  Allergies:  Allergies  Allergen Reactions  . Clinoril [Sulindac]   . Doxycycline   . Ketoconazole   . Naprosyn [Naproxen]   . Neosporin [Neomycin-Bacitracin Zn-Polymyx]   . Voltaren [Diclofenac Sodium]   . Amoxicillin Rash  . Penicillins Rash    Medications:  Scheduled: . sodium chloride flush  3 mL Intravenous Q12H   Continuous: . sodium chloride      No results found for  this or any previous visit (from the past 24 hour(s)).   No results found.  ROS:  As stated above in the HPI otherwise negative.  There were no vitals taken for this visit.    PE: Gen: NAD, Alert and Oriented, complaining of fatigue and yawning frequently HEENT:  [place]/AT, EOMI Neck: Supple, no LAD Lungs: CTA Bilaterally CV: RRR without M/G/R ABM: Soft, NTND, +BS Ext: No C/C/E  Assessment/Plan: 1) GI bleed. 2) Hypotension. 3) Anemia.   The patient did appear fatigued and pale in the office just a couple of hours ago.  A manual blood pressure was performed and she was noted to be hypotensive, however, it may be as a result of her smaller arms and the regular adult cuff.  Because of the persistent symptoms of fatigue/weakness and the melenic stools, the decision was made to admit her to the hospital.  At this time, nursing does not feel that she is appropriate for the Med-Surg floor and recommended a stepdown bed.  This is reasonable given her age and frailty.  There was discussion of an EGD, but her daughter and the patient both do not want to pursue this intervention.  The working diagnosis is an upper GI bleed from NSAID-induced ulcerations.  This should resolve with PPI therapy.  Plan:  1) Pantoprazole 40 mg BID, which is equivalent to omeprazole 20 mg BID. 2) Follow blood pressure.  Hold her routine antihypertensives. 3) Very gently IV hydration. 4) Follow CBC and transfuse if necessary.  Jeovany Huitron D 02/12/2019, 5:12 PM

## 2019-02-12 NOTE — Progress Notes (Signed)
Patient was initially sent to [facility] as a direct admit. [facility]'s charge Manufacturing engineer did not believe the patient was stable enough to come to our floor. The charge nurse spoke with the doctor and the doctor said the patient should go to stepdown. The charge nurse on [facility] spoke with the [facility] said it was fine to take the patient to stepdown. Once on the floor I called the [facility] to confirm. The [facility] said she had given report to the charge nurse on the step down unit. Per the [facility], the nurse receiving the patient should get report from the stepdown charge nurse.

## 2019-02-13 ENCOUNTER — Other Ambulatory Visit: Payer: Self-pay

## 2019-02-13 ENCOUNTER — Encounter (HOSPITAL_COMMUNITY): Payer: Self-pay

## 2019-02-13 LAB — BASIC METABOLIC PANEL
Anion gap: 9 (ref 5–15)
BUN: 56 mg/dL — ABNORMAL HIGH (ref 8–23)
CO2: 21 mmol/L — ABNORMAL LOW (ref 22–32)
Calcium: 8 mg/dL — ABNORMAL LOW (ref 8.9–10.3)
Chloride: 110 mmol/L (ref 98–111)
Creatinine, Ser: 0.49 mg/dL (ref 0.44–1.00)
GFR calc Af Amer: >60 mL/min (ref >60)
GFR calc non Af Amer: >60 mL/min (ref >60)
Glucose, Bld: 89 mg/dL (ref 70–99)
Potassium: 3.9 mmol/L (ref 3.5–5.1)
Sodium: 140 mmol/L (ref 135–145)

## 2019-02-13 LAB — HEMOGLOBIN AND HEMATOCRIT, BLOOD
HCT: 27.5 % — ABNORMAL LOW (ref 36.0–46.0)
Hemoglobin: 8.9 g/dL — ABNORMAL LOW (ref 12.0–15.0)

## 2019-02-13 LAB — PREPARE RBC (CROSSMATCH)

## 2019-02-13 LAB — CBC
HCT: 25.6 % — ABNORMAL LOW (ref 36.0–46.0)
Hemoglobin: 8.2 g/dL — ABNORMAL LOW (ref 12.0–15.0)
MCH: 34.3 pg — ABNORMAL HIGH (ref 26.0–34.0)
MCHC: 32 g/dL (ref 30.0–36.0)
MCV: 107.1 fL — ABNORMAL HIGH (ref 80.0–100.0)
Platelets: 315 10*3/uL (ref 150–400)
RBC: 2.39 MIL/uL — ABNORMAL LOW (ref 3.87–5.11)
RDW: 23.3 % — ABNORMAL HIGH (ref 11.5–15.5)
WBC: 10.8 10*3/uL — ABNORMAL HIGH (ref 4.0–10.5)
nRBC: 0.9 % — ABNORMAL HIGH (ref 0.0–0.2)

## 2019-02-13 LAB — SARS CORONAVIRUS 2 (TAT 6-24 HRS): SARS Coronavirus 2: NEGATIVE

## 2019-02-13 MED ORDER — POLYETHYLENE GLYCOL 3350 17 G PO PACK
17.0000 g | PACK | Freq: Every day | ORAL | Status: DC
  Administered 2019-02-13 – 2019-02-15 (×3): 17 g via ORAL
  Filled 2019-02-13 (×3): qty 1

## 2019-02-13 MED ORDER — SODIUM CHLORIDE 0.9% IV SOLUTION
Freq: Once | INTRAVENOUS | Status: AC
  Administered 2019-02-13: 15:00:00 via INTRAVENOUS

## 2019-02-13 MED ORDER — CHLORHEXIDINE GLUCONATE CLOTH 2 % EX PADS
6.0000 | MEDICATED_PAD | Freq: Every day | CUTANEOUS | Status: DC
  Administered 2019-02-13 – 2019-02-14 (×2): 6 via TOPICAL

## 2019-02-13 NOTE — Progress Notes (Signed)
Subjective: No acute events.  Nursing did not report any bowel movements, but she states she had a small one yesterday.  Objective: Vital signs in last 24 hours: Temp:  [98 F (36.7 C)-98.6 F (37 C)] 98.2 F (36.8 C) (11/05 0347) Pulse Rate:  [72-100] 84 (11/05 0700) Resp:  [14-23] 22 (11/05 0700) BP: (101-140)/(29-76) 137/50 (11/05 0700) SpO2:  [95 %-100 %] 100 % (11/05 0700) Weight:  [57.1 kg] 57.1 kg (11/04 1850)    Intake/Output from previous day: 11/04 0701 - 11/05 0700 In: 439.8 [I.V.:439.8] Out: 400 [Urine:400] Intake/Output this shift: No intake/output data recorded.  General appearance: alert and no distress Resp: clear to auscultation bilaterally Cardio: regular rate and rhythm GI: soft, non-tender; bowel sounds normal; no masses,  no organomegaly Extremities: extremities normal, atraumatic, no cyanosis or edema  Lab Results: Recent Labs    02/12/19 1832 02/13/19 0215  WBC 13.9* 10.8*  HGB 8.2* 8.2*  HCT 25.9* 25.6*  PLT 390 315   BMET Recent Labs    02/12/19 1832 02/13/19 0215  NA 141 140  K 4.3 3.9  CL 110 110  CO2 22 21*  GLUCOSE 117* 89  BUN 59* 56*  CREATININE 0.62 0.49  CALCIUM 8.3* 8.0*   LFT Recent Labs    02/12/19 1832  PROT 5.3*  ALBUMIN 3.0*  AST 33  ALT 30  ALKPHOS 147*  BILITOT 0.8   PT/INR No results for input(s): LABPROT, INR in the last 72 hours. Hepatitis Panel No results for input(s): HEPBSAG, HCVAB, HEPAIGM, HEPBIGM in the last 72 hours. C-Diff No results for input(s): CDIFFTOX in the last 72 hours. Fecal Lactopherrin No results for input(s): FECLLACTOFRN in the last 72 hours.  Studies/Results: No results found.  Medications:  Scheduled: . Chlorhexidine Gluconate Cloth  6 each Topical Daily  . pantoprazole  40 mg Oral BID  . sodium chloride flush  3 mL Intravenous Q12H   Continuous: . sodium chloride 25 mL/hr at 02/13/19 0600    Assessment/Plan: 1) NSAID-induced upper GI bleed. 2) Anemia.   She is  stable.  Her blood pressure is normotensive and she reports that she is feeling more alert today.  Her HGB remains stable and there are no reports of melena or hematochezia.  A blood transfusion was discussed with the patient, but she is reluctant to undergo any transfusions.  Currently she is without SOB or chest pain.  Plan: 1) Continue with pantoprazole. 2) Monitor HGB.  If there is any further drop, she will be encouraged to have a blood transfusion. 3) Advance to a regular diet. 4) Anticipate on D/C tomorrow.  LOS: 1 day   [name] D 02/13/2019, 7:34 AM

## 2019-02-14 ENCOUNTER — Encounter (HOSPITAL_COMMUNITY): Payer: Self-pay

## 2019-02-14 LAB — CBC
HCT: 27.6 % — ABNORMAL LOW (ref 36.0–46.0)
Hemoglobin: 8.9 g/dL — ABNORMAL LOW (ref 12.0–15.0)
MCH: 32.7 pg (ref 26.0–34.0)
MCHC: 32.2 g/dL (ref 30.0–36.0)
MCV: 101.5 fL — ABNORMAL HIGH (ref 80.0–100.0)
Platelets: 334 10*3/uL (ref 150–400)
RBC: 2.72 MIL/uL — ABNORMAL LOW (ref 3.87–5.11)
RDW: 21.7 % — ABNORMAL HIGH (ref 11.5–15.5)
WBC: 9.3 10*3/uL (ref 4.0–10.5)
nRBC: 1.3 % — ABNORMAL HIGH (ref 0.0–0.2)

## 2019-02-14 LAB — BASIC METABOLIC PANEL
Anion gap: 6 (ref 5–15)
BUN: 32 mg/dL — ABNORMAL HIGH (ref 8–23)
CO2: 22 mmol/L (ref 22–32)
Calcium: 8 mg/dL — ABNORMAL LOW (ref 8.9–10.3)
Chloride: 111 mmol/L (ref 98–111)
Creatinine, Ser: 0.51 mg/dL (ref 0.44–1.00)
GFR calc Af Amer: >60 mL/min (ref >60)
GFR calc non Af Amer: >60 mL/min (ref >60)
Glucose, Bld: 92 mg/dL (ref 70–99)
Potassium: 3.7 mmol/L (ref 3.5–5.1)
Sodium: 139 mmol/L (ref 135–145)

## 2019-02-14 LAB — PREPARE RBC (CROSSMATCH)

## 2019-02-14 MED ORDER — SODIUM CHLORIDE 0.9% IV SOLUTION
Freq: Once | INTRAVENOUS | Status: AC
  Administered 2019-02-14: 17:00:00 via INTRAVENOUS

## 2019-02-14 NOTE — Evaluation (Signed)
Physical Therapy Evaluation Patient Details Name: [name] MRN: [id number] DOB: December 23, 1917 Today's Date: 02/14/2019   History of Present Illness  Pt is a 85 year old female admitted for GI bleed presumed to be from NSAIDs  Clinical Impression  Pt admitted with above diagnosis. Pt currently with functional limitations due to the deficits listed below (see PT Problem List). Pt will benefit from skilled PT to increase their independence and safety with mobility to allow discharge to the venue listed below.  Pt ambulated in hallway short distance, limited by fatigue and generalized weakness.  Pt reports she lives in facility and typically uses a rollator.  Pt hopeful for d/c home tomorrow.     Follow Up Recommendations Home health PT    Equipment Recommendations  None recommended by PT    Recommendations for Other Services       Precautions / Restrictions Precautions Precautions: Fall Restrictions Weight Bearing Restrictions: No      Mobility  Bed Mobility Overal bed mobility: Needs Assistance Bed Mobility: Supine to Sit;Sit to Supine     Supine to sit: Min guard Sit to supine: Mod assist   General bed mobility comments: assist for LEs onto bed due to fatigue  Transfers Overall transfer level: Needs assistance Equipment used: Rolling walker (2 wheeled) Transfers: Sit to/from Stand Sit to Stand: Min guard         General transfer comment: min/guard for safety, effortful transitions  Ambulation/Gait Ambulation/Gait assistance: Min guard Gait Distance (Feet): 26 Feet Assistive device: Rolling walker (2 wheeled) Gait Pattern/deviations: Step-through pattern;Decreased stride length     General Gait Details: verbal cues for use of RW, distance to tolerance, pt typically uses rollator and not pleased with RW  Stairs            Wheelchair Mobility    Modified Rankin (Stroke Patients Only)       Balance Overall balance assessment: Needs assistance          Standing balance support: Bilateral upper extremity supported Standing balance-Leahy Scale: Poor Standing balance comment: reliant on UE support                             Pertinent Vitals/Pain Pain Assessment: No/denies pain    Home Living       Type of Home: Independent living facility Home Access: Elevator       Home Equipment: Walker - 4 wheels      Prior Function Level of Independence: Independent with assistive device(s)         Comments: uses rollator     Hand Dominance        Extremity/Trunk Assessment        Lower Extremity Assessment Lower Extremity Assessment: Generalized weakness    Cervical / Trunk Assessment Cervical / Trunk Assessment: Kyphotic  Communication   Communication: HOH  Cognition Arousal/Alertness: Awake/alert Behavior During Therapy: WFL for tasks assessed/performed Overall Cognitive Status: Within Functional Limits for tasks assessed                                        General Comments      Exercises     Assessment/Plan    PT Assessment Patient needs continued PT services  PT Problem List Decreased strength;Decreased mobility;Decreased knowledge of use of DME;Decreased activity tolerance       PT  Treatment Interventions DME instruction;Therapeutic activities;Functional mobility training;Gait training;Therapeutic exercise;Balance training;Patient/family education    PT Goals (Current goals can be found in the Care Plan section)  Acute Rehab PT Goals PT Goal Formulation: With patient Time For Goal Achievement: 02/28/19 Potential to Achieve Goals: Good    Frequency Min 3X/week   Barriers to discharge        Co-evaluation               AM-PAC PT "6 Clicks" Mobility  Outcome Measure Help needed turning from your back to your side while in a flat bed without using bedrails?: A Little Help needed moving from lying on your back to sitting on the side of a flat bed  without using bedrails?: A Little Help needed moving to and from a bed to a chair (including a wheelchair)?: A Little Help needed standing up from a chair using your arms (e.g., wheelchair or bedside chair)?: A Little Help needed to walk in hospital room?: A Little Help needed climbing 3-5 steps with a railing? : A Lot 6 Click Score: 17    End of Session   Activity Tolerance: Patient limited by fatigue Patient left: in bed;with call bell/phone within reach;with bed alarm set Nurse Communication: Mobility status PT Visit Diagnosis: Other abnormalities of gait and mobility (R26.89)    Time: [id number] PT Time Calculation (min) (ACUTE ONLY): 14 min   Charges:   PT Evaluation $PT Eval Low Complexity: [place], PT, DPT Acute Rehabilitation Services Office: [phone] Pager: [phone]  Trena Platt 02/14/2019, 2:50 PM

## 2019-02-14 NOTE — Progress Notes (Signed)
Subjective: No complaints.  Objective: Vital signs in last 24 hours: Temp:  [97.5 F (36.4 C)-98.1 F (36.7 C)] 97.7 F (36.5 C) (11/06 0759) Pulse Rate:  [64-100] 77 (11/06 0800) Resp:  [16-28] 16 (11/06 0800) BP: (107-156)/(24-103) 140/52 (11/06 0800) SpO2:  [94 %-100 %] 97 % (11/06 0800)    Intake/Output from previous day: 11/05 0701 - 11/06 0700 In: 862.3 [I.V.:547.3; Blood:315] Out: 400 [Urine:400] Intake/Output this shift: No intake/output data recorded.  General appearance: alert and no distress Resp: clear to auscultation bilaterally Cardio: regular rate and rhythm GI: soft, non-tender; bowel sounds normal; no masses,  no organomegaly Extremities: extremities normal, atraumatic, no cyanosis or edema  Lab Results: Recent Labs    02/12/19 1832 02/13/19 0215 02/13/19 1922 02/14/19 0212  WBC 13.9* 10.8*  --  9.3  HGB 8.2* 8.2* 8.9* 8.9*  HCT 25.9* 25.6* 27.5* 27.6*  PLT 390 315  --  334   BMET Recent Labs    02/12/19 1832 02/13/19 0215 02/14/19 0212  NA 141 140 139  K 4.3 3.9 3.7  CL 110 110 111  CO2 22 21* 22  GLUCOSE 117* 89 92  BUN 59* 56* 32*  CREATININE 0.62 0.49 0.51  CALCIUM 8.3* 8.0* 8.0*   LFT Recent Labs    02/12/19 1832  PROT 5.3*  ALBUMIN 3.0*  AST 33  ALT 30  ALKPHOS 147*  BILITOT 0.8   PT/INR No results for input(s): LABPROT, INR in the last 72 hours. Hepatitis Panel No results for input(s): HEPBSAG, HCVAB, HEPAIGM, HEPBIGM in the last 72 hours. C-Diff No results for input(s): CDIFFTOX in the last 72 hours. Fecal Lactopherrin No results for input(s): FECLLACTOFRN in the last 72 hours.  Studies/Results: No results found.  Medications:  Scheduled: . sodium chloride   Intravenous Once  . Chlorhexidine Gluconate Cloth  6 each Topical Daily  . pantoprazole  40 mg Oral BID  . polyethylene glycol  17 g Oral Daily  . sodium chloride flush  3 mL Intravenous Q12H   Continuous: . sodium chloride 25 mL/hr at 02/14/19 0400     Assessment/Plan: 1) GI bleed presumed to be from NSAIDs. 2) Symptomatic anemia.   The patient has improved.  I helped her to ambulate over to the restroom where she had a bowel movement.  The bowel movement was observed to be formed and black, but there was diffusion of blood into the toilet water from the stool.  This most likely represents old blood, but she is still symptomatic with the ambulation to the toilet.  She has more strength, but the return trip to her bed was tiring for her.  Her HGB did increase, but not as much as anticipated.  There does not appear to be active bleeding.  Plan: 1) Transfuse one unit of PRBC. 2) OOB. 3) Physical Therapy. 4) Transfer to Med-Surg. 5) Follow HGB. 6) D/C IV fluids.  LOS: 2 days   Latashia Koch D 02/14/2019, 11:17 AM

## 2019-02-15 DIAGNOSIS — D649 Anemia, unspecified: Secondary | ICD-10-CM | POA: Diagnosis not present

## 2019-02-15 DIAGNOSIS — M6281 Muscle weakness (generalized): Secondary | ICD-10-CM | POA: Diagnosis not present

## 2019-02-15 DIAGNOSIS — E559 Vitamin D deficiency, unspecified: Secondary | ICD-10-CM | POA: Diagnosis not present

## 2019-02-15 DIAGNOSIS — R2689 Other abnormalities of gait and mobility: Secondary | ICD-10-CM | POA: Diagnosis not present

## 2019-02-15 DIAGNOSIS — M810 Age-related osteoporosis without current pathological fracture: Secondary | ICD-10-CM | POA: Diagnosis not present

## 2019-02-15 DIAGNOSIS — I10 Essential (primary) hypertension: Secondary | ICD-10-CM | POA: Diagnosis not present

## 2019-02-15 DIAGNOSIS — M1990 Unspecified osteoarthritis, unspecified site: Secondary | ICD-10-CM | POA: Diagnosis not present

## 2019-02-15 DIAGNOSIS — K922 Gastrointestinal hemorrhage, unspecified: Secondary | ICD-10-CM

## 2019-02-15 DIAGNOSIS — Z111 Encounter for screening for respiratory tuberculosis: Secondary | ICD-10-CM | POA: Diagnosis not present

## 2019-02-15 DIAGNOSIS — K219 Gastro-esophageal reflux disease without esophagitis: Secondary | ICD-10-CM | POA: Diagnosis not present

## 2019-02-15 DIAGNOSIS — Z03818 Encounter for observation for suspected exposure to other biological agents ruled out: Secondary | ICD-10-CM | POA: Diagnosis not present

## 2019-02-15 DIAGNOSIS — M150 Primary generalized (osteo)arthritis: Secondary | ICD-10-CM | POA: Diagnosis not present

## 2019-02-15 LAB — CBC
HCT: 28.4 % — ABNORMAL LOW (ref 36.0–46.0)
Hemoglobin: 9.4 g/dL — ABNORMAL LOW (ref 12.0–15.0)
MCH: 32.2 pg (ref 26.0–34.0)
MCHC: 33.1 g/dL (ref 30.0–36.0)
MCV: 97.3 fL (ref 80.0–100.0)
Platelets: 365 10*3/uL (ref 150–400)
RBC: 2.92 MIL/uL — ABNORMAL LOW (ref 3.87–5.11)
RDW: 21.2 % — ABNORMAL HIGH (ref 11.5–15.5)
WBC: 10.1 10*3/uL (ref 4.0–10.5)
nRBC: 2.3 % — ABNORMAL HIGH (ref 0.0–0.2)

## 2019-02-15 LAB — BPAM RBC
Blood Product Expiration Date: 202012022359
Blood Product Expiration Date: 202012052359
ISSUE DATE / TIME: 202011051507
ISSUE DATE / TIME: 202011061646
Unit Type and Rh: 6200
Unit Type and Rh: 6200

## 2019-02-15 LAB — TYPE AND SCREEN
ABO/RH(D): A POS
Antibody Screen: NEGATIVE
Unit division: 0
Unit division: 0

## 2019-02-15 MED ORDER — PANTOPRAZOLE SODIUM 40 MG PO TBEC: 40.0000 mg | DELAYED_RELEASE_TABLET | Freq: Two times a day (BID) | ORAL | 0 refills | Status: DC

## 2019-02-15 NOTE — Progress Notes (Signed)
Patient discharged to home with family, discharge instructions reviewed with patient and daughter BJ who verbalized understanding.

## 2019-02-15 NOTE — Progress Notes (Addendum)
     [name] Progress Note  CC:  Can I go home?  Assessment / Plan: GI bleed presenting with melena and GI blood loss anemia    - endoscopic evaluation was not pursued given the patient's advanced age Normocytic anemia    - hemoglobin now stable Recently using NSAIDs (Aleve QID) for arthritis in addition to chronic prednisone  I am cross-covering for Dr. Benson Norway today. [name] remains hemodynamically stable. Hemoglobin stable. She appears ready and motivated for discharge.   - Discharge back to skilled facility.  - Repeat CBC in 3-5 days.  - Pantoprazole 40 mg BID x 8 weeks, and then daily while on NSAIDs and/or prednisone - Avoid NSAIDs as able. - Consider outpatient iron supplement if the patient can handle the potential side effects.  - Follow-up with Dr. Benson Norway as he has arranged.   Subjective: Feeling well. No bleeding noted over night. No GI complaints. Wants to go home.  Objective:  Vital signs in last 24 hours: Temp:  [97.5 F (36.4 C)-98.6 F (37 C)] 97.5 F (36.4 C) (11/07 0524) Pulse Rate:  [77-112] 77 (11/07 0524) Resp:  [16-35] 16 (11/07 0524) BP: (126-161)/(39-66) 161/55 (11/07 0524) SpO2:  [94 %-97 %] 95 % (11/07 0524) Last BM Date: 02/14/19 General:   Alert, in NAD, looks younger than her stated age Heart:  Regular rate and rhythm; no murmurs Pulm: Clear anteriorly; no wheezing Abdomen:  Soft. Nontender. Nondistended. Normal bowel sounds. No rebound or guarding. LAD: No inguinal or umbilical LAD Extremities:  Without edema. Neurologic:  Alert and  oriented x4;  grossly normal neurologically. Psych:  Alert and cooperative. Normal mood and affect.  Lab Results: Recent Labs    02/13/19 0215 02/13/19 1922 02/14/19 0212 02/15/19 0007  WBC 10.8*  --  9.3 10.1  HGB 8.2* 8.9* 8.9* 9.4*  HCT 25.6* 27.5* 27.6* 28.4*  PLT 315  --  334 365   BMET Recent Labs    02/12/19 1832 02/13/19 0215 02/14/19 0212  NA 141 140 139  K 4.3 3.9 3.7   CL 110 110 111  CO2 22 21* 22  GLUCOSE 117* 89 92  BUN 59* 56* 32*  CREATININE 0.62 0.49 0.51  CALCIUM 8.3* 8.0* 8.0*   LFT Recent Labs    02/12/19 1832  PROT 5.3*  ALBUMIN 3.0*  AST 33  ALT 30  ALKPHOS 147*  BILITOT 0.8      LOS: 3 days   Th[place]  02/15/2019, 10:33 AM

## 2019-02-17 DIAGNOSIS — K219 Gastro-esophageal reflux disease without esophagitis: Secondary | ICD-10-CM | POA: Diagnosis not present

## 2019-02-17 DIAGNOSIS — K922 Gastrointestinal hemorrhage, unspecified: Secondary | ICD-10-CM | POA: Diagnosis not present

## 2019-02-17 DIAGNOSIS — I10 Essential (primary) hypertension: Secondary | ICD-10-CM | POA: Diagnosis not present

## 2019-02-17 DIAGNOSIS — M6281 Muscle weakness (generalized): Secondary | ICD-10-CM | POA: Diagnosis not present

## 2019-02-17 DIAGNOSIS — E559 Vitamin D deficiency, unspecified: Secondary | ICD-10-CM | POA: Diagnosis not present

## 2019-02-17 NOTE — Discharge Summary (Signed)
Physician Discharge Summary  Patient ID: [name] MRN: [id number] DOB/AGE: 1917/07/28 85 y.o.  Admit date: 02/12/2019 Discharge date: 02/17/2019  Admission Diagnoses:  1) NSAID-induced GI bleed.  2) Symptomatic anemia  Discharge Diagnoses: Same. Active Problems:   GI bleed   Discharged Condition: good  Hospital Course: The patient was admitted for symptomatic anemia and melena.  Her HGB was noted to be at 8.2 g/dL on admission.  She was provided with one unit of PRBC and she was more alert and sanguine the next day, however, she was not certain if she was still fatigued.  Assistance was provided for her on hospital day #2 to the restroom to ascertain her strength and tolerance.  The patient's fatigue still persisted, but it was hard to discern of this was as a result of her anemia or the lack of assistance from her four wheeled walker.  Her post transfusion HGB after one unit was at 8.9 g/dL.  With the persistent fatigue and her age, she was provided with another unit of PRBC.  Her HGB increased up to 9.4 g/dL and she did feel much better.  At that time point she felt ready for discharge and she was sent back to her care facility.  During this time in the hospital she was placed on BID PPI and all NSAIDs were held.  She did not have any further melenic stools.  The patient did have a formed back stool on hospital day #2, but this was felt to be old blood.  Consults: Physical Therapy  Significant Diagnostic Studies: labs: CBC  Treatments: Blood transfusion x 2 units of PRBC.  Discharge Exam: Blood pressure (!) 161/55, pulse 77, temperature (!) 97.5 F (36.4 C), temperature source Oral, resp. rate 16, height 4\' 11"  (1.499 m), weight 57.1 kg, SpO2 95 %. General appearance: alert and no distress Resp: clear to auscultation bilaterally GI: soft, non-tender; bowel sounds normal; no masses,  no organomegaly Extremities: extremities normal, atraumatic, no cyanosis or edema  Disposition:   1) Return to SNF. 2) Follow up in the office in two weeks.  Allergies as of 02/15/2019      Reactions   Clinoril [sulindac] Other (See Comments)   unknown   Doxycycline Other (See Comments)   unknown   Ketoconazole Other (See Comments)   unknown   Naprosyn [naproxen] Other (See Comments)   unknown   Neosporin [neomycin-bacitracin Zn-polymyx] Other (See Comments)   unknown   Voltaren [diclofenac Sodium] Other (See Comments)   unknown   Amoxicillin Rash   Penicillins Rash   Did it involve swelling of the face/tongue/throat, SOB, or low BP? No Did it involve sudden or severe rash/hives, skin peeling, or any reaction on the inside of your mouth or nose? Yes Did you need to seek medical attention at a hospital or doctor's office? No When did it last happen?childhood If all above answers are "NO", may proceed with cephalosporin use.      Medication List    STOP taking these medications   cephALEXin 500 MG capsule Commonly known as: KEFLEX   sulfamethoxazole-trimethoprim 800-160 MG tablet Commonly known as: BACTRIM DS     TAKE these medications   acetaminophen 500 MG tablet Commonly known as: TYLENOL Take 500 mg by mouth every 6 (six) hours as needed for mild pain.   albuterol 108 (90 Base) MCG/ACT inhaler Commonly known as: VENTOLIN HFA Inhale into the lungs every 6 (six) hours as needed for wheezing or shortness of breath.  calcium carbonate 600 MG Tabs tablet Commonly known as: OS-CAL Take 600 mg by mouth daily.   nitroGLYCERIN 0.4 MG SL tablet Commonly known as: NITROSTAT Place 0.4 mg under the tongue every 5 (five) minutes as needed for chest pain.   pantoprazole 40 MG tablet Commonly known as: PROTONIX Take 1 tablet (40 mg total) by mouth 2 (two) times daily.   predniSONE 2.5 MG tablet Commonly known as: DELTASONE Take 2.5 mg by mouth daily with breakfast.   PROBIOTIC DAILY PO Take by mouth.   vitamin C 500 MG tablet Commonly known as: ASCORBIC  ACID Take 500 mg by mouth daily.        SignedCarol Ada D 02/17/2019, 8:12 AM

## 2019-02-19 DIAGNOSIS — K219 Gastro-esophageal reflux disease without esophagitis: Secondary | ICD-10-CM | POA: Diagnosis not present

## 2019-02-19 DIAGNOSIS — E559 Vitamin D deficiency, unspecified: Secondary | ICD-10-CM | POA: Diagnosis not present

## 2019-02-19 DIAGNOSIS — I10 Essential (primary) hypertension: Secondary | ICD-10-CM | POA: Diagnosis not present

## 2019-02-19 DIAGNOSIS — K922 Gastrointestinal hemorrhage, unspecified: Secondary | ICD-10-CM | POA: Diagnosis not present

## 2019-02-19 DIAGNOSIS — M6281 Muscle weakness (generalized): Secondary | ICD-10-CM | POA: Diagnosis not present

## 2019-02-20 DIAGNOSIS — M150 Primary generalized (osteo)arthritis: Secondary | ICD-10-CM | POA: Diagnosis not present

## 2019-02-20 DIAGNOSIS — M810 Age-related osteoporosis without current pathological fracture: Secondary | ICD-10-CM | POA: Diagnosis not present

## 2019-02-20 DIAGNOSIS — E559 Vitamin D deficiency, unspecified: Secondary | ICD-10-CM | POA: Diagnosis not present

## 2019-02-20 DIAGNOSIS — K219 Gastro-esophageal reflux disease without esophagitis: Secondary | ICD-10-CM | POA: Diagnosis not present

## 2019-02-20 DIAGNOSIS — D649 Anemia, unspecified: Secondary | ICD-10-CM | POA: Diagnosis not present

## 2019-02-20 DIAGNOSIS — M6281 Muscle weakness (generalized): Secondary | ICD-10-CM | POA: Diagnosis not present

## 2019-02-20 DIAGNOSIS — K922 Gastrointestinal hemorrhage, unspecified: Secondary | ICD-10-CM | POA: Diagnosis not present

## 2019-02-24 DIAGNOSIS — M810 Age-related osteoporosis without current pathological fracture: Secondary | ICD-10-CM | POA: Diagnosis not present

## 2019-02-24 DIAGNOSIS — I10 Essential (primary) hypertension: Secondary | ICD-10-CM | POA: Diagnosis not present

## 2019-02-24 DIAGNOSIS — K922 Gastrointestinal hemorrhage, unspecified: Secondary | ICD-10-CM | POA: Diagnosis not present

## 2019-02-24 DIAGNOSIS — K219 Gastro-esophageal reflux disease without esophagitis: Secondary | ICD-10-CM | POA: Diagnosis not present

## 2019-02-24 DIAGNOSIS — D649 Anemia, unspecified: Secondary | ICD-10-CM | POA: Diagnosis not present

## 2019-02-24 DIAGNOSIS — M150 Primary generalized (osteo)arthritis: Secondary | ICD-10-CM | POA: Diagnosis not present

## 2019-02-24 DIAGNOSIS — M6281 Muscle weakness (generalized): Secondary | ICD-10-CM | POA: Diagnosis not present

## 2019-02-24 DIAGNOSIS — E559 Vitamin D deficiency, unspecified: Secondary | ICD-10-CM | POA: Diagnosis not present

## 2019-02-26 DIAGNOSIS — K922 Gastrointestinal hemorrhage, unspecified: Secondary | ICD-10-CM | POA: Diagnosis not present

## 2019-02-26 DIAGNOSIS — M1990 Unspecified osteoarthritis, unspecified site: Secondary | ICD-10-CM | POA: Diagnosis not present

## 2019-02-26 DIAGNOSIS — M6281 Muscle weakness (generalized): Secondary | ICD-10-CM | POA: Diagnosis not present

## 2019-02-26 DIAGNOSIS — R2689 Other abnormalities of gait and mobility: Secondary | ICD-10-CM | POA: Diagnosis not present

## 2019-02-27 DIAGNOSIS — R2689 Other abnormalities of gait and mobility: Secondary | ICD-10-CM | POA: Diagnosis not present

## 2019-02-27 DIAGNOSIS — M6281 Muscle weakness (generalized): Secondary | ICD-10-CM | POA: Diagnosis not present

## 2019-02-27 DIAGNOSIS — K922 Gastrointestinal hemorrhage, unspecified: Secondary | ICD-10-CM | POA: Diagnosis not present

## 2019-02-27 DIAGNOSIS — M1990 Unspecified osteoarthritis, unspecified site: Secondary | ICD-10-CM | POA: Diagnosis not present

## 2019-03-03 DIAGNOSIS — R609 Edema, unspecified: Secondary | ICD-10-CM | POA: Diagnosis not present

## 2019-03-04 DIAGNOSIS — M6281 Muscle weakness (generalized): Secondary | ICD-10-CM | POA: Diagnosis not present

## 2019-03-04 DIAGNOSIS — R2689 Other abnormalities of gait and mobility: Secondary | ICD-10-CM | POA: Diagnosis not present

## 2019-03-04 DIAGNOSIS — K922 Gastrointestinal hemorrhage, unspecified: Secondary | ICD-10-CM | POA: Diagnosis not present

## 2019-03-04 DIAGNOSIS — M1990 Unspecified osteoarthritis, unspecified site: Secondary | ICD-10-CM | POA: Diagnosis not present

## 2019-03-13 DIAGNOSIS — K922 Gastrointestinal hemorrhage, unspecified: Secondary | ICD-10-CM | POA: Diagnosis not present

## 2019-03-13 DIAGNOSIS — M1990 Unspecified osteoarthritis, unspecified site: Secondary | ICD-10-CM | POA: Diagnosis not present

## 2019-03-13 DIAGNOSIS — R2689 Other abnormalities of gait and mobility: Secondary | ICD-10-CM | POA: Diagnosis not present

## 2019-03-13 DIAGNOSIS — M6281 Muscle weakness (generalized): Secondary | ICD-10-CM | POA: Diagnosis not present

## 2019-03-14 DIAGNOSIS — K922 Gastrointestinal hemorrhage, unspecified: Secondary | ICD-10-CM | POA: Diagnosis not present

## 2019-03-14 DIAGNOSIS — M1990 Unspecified osteoarthritis, unspecified site: Secondary | ICD-10-CM | POA: Diagnosis not present

## 2019-03-14 DIAGNOSIS — M6281 Muscle weakness (generalized): Secondary | ICD-10-CM | POA: Diagnosis not present

## 2019-03-14 DIAGNOSIS — R2689 Other abnormalities of gait and mobility: Secondary | ICD-10-CM | POA: Diagnosis not present

## 2019-03-17 DIAGNOSIS — R2689 Other abnormalities of gait and mobility: Secondary | ICD-10-CM | POA: Diagnosis not present

## 2019-03-17 DIAGNOSIS — K922 Gastrointestinal hemorrhage, unspecified: Secondary | ICD-10-CM | POA: Diagnosis not present

## 2019-03-17 DIAGNOSIS — M6281 Muscle weakness (generalized): Secondary | ICD-10-CM | POA: Diagnosis not present

## 2019-03-17 DIAGNOSIS — M1990 Unspecified osteoarthritis, unspecified site: Secondary | ICD-10-CM | POA: Diagnosis not present

## 2019-03-19 DIAGNOSIS — R2689 Other abnormalities of gait and mobility: Secondary | ICD-10-CM | POA: Diagnosis not present

## 2019-03-19 DIAGNOSIS — M6281 Muscle weakness (generalized): Secondary | ICD-10-CM | POA: Diagnosis not present

## 2019-03-19 DIAGNOSIS — M1990 Unspecified osteoarthritis, unspecified site: Secondary | ICD-10-CM | POA: Diagnosis not present

## 2019-03-19 DIAGNOSIS — K922 Gastrointestinal hemorrhage, unspecified: Secondary | ICD-10-CM | POA: Diagnosis not present

## 2019-03-21 DIAGNOSIS — M6281 Muscle weakness (generalized): Secondary | ICD-10-CM | POA: Diagnosis not present

## 2019-03-21 DIAGNOSIS — M1990 Unspecified osteoarthritis, unspecified site: Secondary | ICD-10-CM | POA: Diagnosis not present

## 2019-03-21 DIAGNOSIS — R2689 Other abnormalities of gait and mobility: Secondary | ICD-10-CM | POA: Diagnosis not present

## 2019-03-21 DIAGNOSIS — K922 Gastrointestinal hemorrhage, unspecified: Secondary | ICD-10-CM | POA: Diagnosis not present

## 2019-04-01 ENCOUNTER — Other Ambulatory Visit: Payer: Self-pay | Admitting: Physician Assistant

## 2019-04-28 DIAGNOSIS — D649 Anemia, unspecified: Secondary | ICD-10-CM | POA: Diagnosis not present

## 2019-04-28 DIAGNOSIS — K922 Gastrointestinal hemorrhage, unspecified: Secondary | ICD-10-CM | POA: Diagnosis not present

## 2019-07-01 DIAGNOSIS — R399 Unspecified symptoms and signs involving the genitourinary system: Secondary | ICD-10-CM | POA: Diagnosis not present

## 2019-07-01 DIAGNOSIS — N390 Urinary tract infection, site not specified: Secondary | ICD-10-CM | POA: Diagnosis not present

## 2019-08-21 DIAGNOSIS — D649 Anemia, unspecified: Secondary | ICD-10-CM | POA: Diagnosis not present

## 2019-09-15 DIAGNOSIS — Z7952 Long term (current) use of systemic steroids: Secondary | ICD-10-CM | POA: Diagnosis not present

## 2019-09-15 DIAGNOSIS — M549 Dorsalgia, unspecified: Secondary | ICD-10-CM | POA: Diagnosis not present

## 2019-09-15 DIAGNOSIS — M0600 Rheumatoid arthritis without rheumatoid factor, unspecified site: Secondary | ICD-10-CM | POA: Diagnosis not present

## 2019-09-15 DIAGNOSIS — E039 Hypothyroidism, unspecified: Secondary | ICD-10-CM | POA: Diagnosis not present

## 2019-09-15 DIAGNOSIS — M1990 Unspecified osteoarthritis, unspecified site: Secondary | ICD-10-CM | POA: Diagnosis not present

## 2019-09-15 DIAGNOSIS — M810 Age-related osteoporosis without current pathological fracture: Secondary | ICD-10-CM | POA: Diagnosis not present

## 2019-09-15 DIAGNOSIS — E785 Hyperlipidemia, unspecified: Secondary | ICD-10-CM | POA: Diagnosis not present

## 2019-09-15 DIAGNOSIS — Z8719 Personal history of other diseases of the digestive system: Secondary | ICD-10-CM | POA: Diagnosis not present

## 2019-09-24 DIAGNOSIS — N390 Urinary tract infection, site not specified: Secondary | ICD-10-CM | POA: Diagnosis not present

## 2019-09-24 DIAGNOSIS — A499 Bacterial infection, unspecified: Secondary | ICD-10-CM | POA: Diagnosis not present

## 2019-09-24 DIAGNOSIS — Z6827 Body mass index (BMI) 27.0-27.9, adult: Secondary | ICD-10-CM | POA: Diagnosis not present

## 2020-03-18 DIAGNOSIS — M810 Age-related osteoporosis without current pathological fracture: Secondary | ICD-10-CM | POA: Diagnosis not present

## 2020-03-18 DIAGNOSIS — M1990 Unspecified osteoarthritis, unspecified site: Secondary | ICD-10-CM | POA: Diagnosis not present

## 2020-03-18 DIAGNOSIS — M0600 Rheumatoid arthritis without rheumatoid factor, unspecified site: Secondary | ICD-10-CM | POA: Diagnosis not present

## 2020-03-18 DIAGNOSIS — M549 Dorsalgia, unspecified: Secondary | ICD-10-CM | POA: Diagnosis not present

## 2020-03-18 DIAGNOSIS — Z7952 Long term (current) use of systemic steroids: Secondary | ICD-10-CM | POA: Diagnosis not present

## 2020-03-18 DIAGNOSIS — Z8719 Personal history of other diseases of the digestive system: Secondary | ICD-10-CM | POA: Diagnosis not present

## 2020-03-18 DIAGNOSIS — E785 Hyperlipidemia, unspecified: Secondary | ICD-10-CM | POA: Diagnosis not present

## 2020-03-18 DIAGNOSIS — E039 Hypothyroidism, unspecified: Secondary | ICD-10-CM | POA: Diagnosis not present

## 2020-05-04 DIAGNOSIS — I10 Essential (primary) hypertension: Secondary | ICD-10-CM | POA: Diagnosis not present

## 2020-05-04 DIAGNOSIS — M1990 Unspecified osteoarthritis, unspecified site: Secondary | ICD-10-CM | POA: Diagnosis not present

## 2020-05-04 DIAGNOSIS — D649 Anemia, unspecified: Secondary | ICD-10-CM | POA: Diagnosis not present

## 2020-08-11 DIAGNOSIS — E02 Subclinical iodine-deficiency hypothyroidism: Secondary | ICD-10-CM | POA: Diagnosis not present

## 2020-08-11 DIAGNOSIS — D692 Other nonthrombocytopenic purpura: Secondary | ICD-10-CM | POA: Diagnosis not present

## 2020-08-11 DIAGNOSIS — I700 Atherosclerosis of aorta: Secondary | ICD-10-CM | POA: Diagnosis not present

## 2020-08-11 DIAGNOSIS — I10 Essential (primary) hypertension: Secondary | ICD-10-CM | POA: Diagnosis not present

## 2020-08-11 DIAGNOSIS — D170 Benign lipomatous neoplasm of skin and subcutaneous tissue of head, face and neck: Secondary | ICD-10-CM | POA: Diagnosis not present

## 2020-08-11 DIAGNOSIS — G44209 Tension-type headache, unspecified, not intractable: Secondary | ICD-10-CM | POA: Diagnosis not present

## 2020-08-11 DIAGNOSIS — M064 Inflammatory polyarthropathy: Secondary | ICD-10-CM | POA: Diagnosis not present

## 2020-08-11 DIAGNOSIS — E039 Hypothyroidism, unspecified: Secondary | ICD-10-CM | POA: Diagnosis not present

## 2020-08-11 DIAGNOSIS — Z8659 Personal history of other mental and behavioral disorders: Secondary | ICD-10-CM | POA: Diagnosis not present

## 2020-08-11 DIAGNOSIS — Z791 Long term (current) use of non-steroidal anti-inflammatories (NSAID): Secondary | ICD-10-CM | POA: Diagnosis not present

## 2020-08-11 DIAGNOSIS — M810 Age-related osteoporosis without current pathological fracture: Secondary | ICD-10-CM | POA: Diagnosis not present

## 2020-08-24 DIAGNOSIS — D539 Nutritional anemia, unspecified: Secondary | ICD-10-CM | POA: Diagnosis not present

## 2020-08-24 DIAGNOSIS — E7800 Pure hypercholesterolemia, unspecified: Secondary | ICD-10-CM | POA: Diagnosis not present

## 2020-08-24 DIAGNOSIS — R8281 Pyuria: Secondary | ICD-10-CM | POA: Diagnosis not present

## 2020-09-01 DIAGNOSIS — N3000 Acute cystitis without hematuria: Secondary | ICD-10-CM | POA: Diagnosis not present

## 2020-09-01 DIAGNOSIS — R350 Frequency of micturition: Secondary | ICD-10-CM | POA: Diagnosis not present

## 2020-09-01 DIAGNOSIS — R300 Dysuria: Secondary | ICD-10-CM | POA: Diagnosis not present

## 2020-09-07 DIAGNOSIS — R300 Dysuria: Secondary | ICD-10-CM | POA: Diagnosis not present

## 2020-09-07 DIAGNOSIS — R350 Frequency of micturition: Secondary | ICD-10-CM | POA: Diagnosis not present

## 2020-09-07 DIAGNOSIS — N3000 Acute cystitis without hematuria: Secondary | ICD-10-CM | POA: Diagnosis not present

## 2020-09-10 DIAGNOSIS — R300 Dysuria: Secondary | ICD-10-CM | POA: Diagnosis not present

## 2020-09-13 DIAGNOSIS — N3000 Acute cystitis without hematuria: Secondary | ICD-10-CM | POA: Diagnosis not present

## 2020-09-13 DIAGNOSIS — R350 Frequency of micturition: Secondary | ICD-10-CM | POA: Diagnosis not present

## 2020-09-13 DIAGNOSIS — Z87440 Personal history of urinary (tract) infections: Secondary | ICD-10-CM | POA: Diagnosis not present

## 2020-09-16 DIAGNOSIS — E039 Hypothyroidism, unspecified: Secondary | ICD-10-CM | POA: Diagnosis not present

## 2020-09-16 DIAGNOSIS — Z8719 Personal history of other diseases of the digestive system: Secondary | ICD-10-CM | POA: Diagnosis not present

## 2020-09-16 DIAGNOSIS — M0600 Rheumatoid arthritis without rheumatoid factor, unspecified site: Secondary | ICD-10-CM | POA: Diagnosis not present

## 2020-09-16 DIAGNOSIS — E785 Hyperlipidemia, unspecified: Secondary | ICD-10-CM | POA: Diagnosis not present

## 2020-09-16 DIAGNOSIS — M549 Dorsalgia, unspecified: Secondary | ICD-10-CM | POA: Diagnosis not present

## 2020-09-16 DIAGNOSIS — M810 Age-related osteoporosis without current pathological fracture: Secondary | ICD-10-CM | POA: Diagnosis not present

## 2020-09-16 DIAGNOSIS — M1990 Unspecified osteoarthritis, unspecified site: Secondary | ICD-10-CM | POA: Diagnosis not present

## 2020-09-16 DIAGNOSIS — Z7952 Long term (current) use of systemic steroids: Secondary | ICD-10-CM | POA: Diagnosis not present

## 2020-09-29 DIAGNOSIS — Z87440 Personal history of urinary (tract) infections: Secondary | ICD-10-CM | POA: Diagnosis not present

## 2020-09-29 DIAGNOSIS — S81802A Unspecified open wound, left lower leg, initial encounter: Secondary | ICD-10-CM | POA: Diagnosis not present

## 2020-09-29 DIAGNOSIS — S8011XA Contusion of right lower leg, initial encounter: Secondary | ICD-10-CM | POA: Diagnosis not present

## 2020-09-29 DIAGNOSIS — R58 Hemorrhage, not elsewhere classified: Secondary | ICD-10-CM | POA: Diagnosis not present

## 2020-09-29 DIAGNOSIS — S8012XA Contusion of left lower leg, initial encounter: Secondary | ICD-10-CM | POA: Diagnosis not present

## 2020-09-30 DIAGNOSIS — R300 Dysuria: Secondary | ICD-10-CM | POA: Diagnosis not present

## 2020-10-08 DIAGNOSIS — S8011XD Contusion of right lower leg, subsequent encounter: Secondary | ICD-10-CM | POA: Diagnosis not present

## 2020-10-08 DIAGNOSIS — R600 Localized edema: Secondary | ICD-10-CM | POA: Diagnosis not present

## 2020-10-08 DIAGNOSIS — S8012XD Contusion of left lower leg, subsequent encounter: Secondary | ICD-10-CM | POA: Diagnosis not present

## 2020-10-08 DIAGNOSIS — R58 Hemorrhage, not elsewhere classified: Secondary | ICD-10-CM | POA: Diagnosis not present

## 2020-10-22 DIAGNOSIS — H6502 Acute serous otitis media, left ear: Secondary | ICD-10-CM | POA: Diagnosis not present

## 2020-10-22 DIAGNOSIS — I517 Cardiomegaly: Secondary | ICD-10-CM | POA: Diagnosis not present

## 2020-10-22 DIAGNOSIS — J069 Acute upper respiratory infection, unspecified: Secondary | ICD-10-CM | POA: Diagnosis not present

## 2020-10-22 DIAGNOSIS — R051 Acute cough: Secondary | ICD-10-CM | POA: Diagnosis not present

## 2020-10-22 DIAGNOSIS — R0602 Shortness of breath: Secondary | ICD-10-CM | POA: Diagnosis not present

## 2020-10-25 DIAGNOSIS — D649 Anemia, unspecified: Secondary | ICD-10-CM | POA: Diagnosis not present

## 2020-10-25 DIAGNOSIS — R300 Dysuria: Secondary | ICD-10-CM | POA: Diagnosis not present

## 2020-10-25 DIAGNOSIS — E559 Vitamin D deficiency, unspecified: Secondary | ICD-10-CM | POA: Diagnosis not present

## 2020-10-28 DIAGNOSIS — I10 Essential (primary) hypertension: Secondary | ICD-10-CM | POA: Diagnosis not present

## 2020-10-28 DIAGNOSIS — Z7952 Long term (current) use of systemic steroids: Secondary | ICD-10-CM | POA: Diagnosis not present

## 2020-10-28 DIAGNOSIS — I83009 Varicose veins of unspecified lower extremity with ulcer of unspecified site: Secondary | ICD-10-CM | POA: Diagnosis not present

## 2020-10-28 DIAGNOSIS — I83029 Varicose veins of left lower extremity with ulcer of unspecified site: Secondary | ICD-10-CM | POA: Diagnosis not present

## 2020-10-28 DIAGNOSIS — D692 Other nonthrombocytopenic purpura: Secondary | ICD-10-CM | POA: Diagnosis not present

## 2020-10-28 DIAGNOSIS — M810 Age-related osteoporosis without current pathological fracture: Secondary | ICD-10-CM | POA: Diagnosis not present

## 2020-10-28 DIAGNOSIS — I700 Atherosclerosis of aorta: Secondary | ICD-10-CM | POA: Diagnosis not present

## 2020-10-28 DIAGNOSIS — E039 Hypothyroidism, unspecified: Secondary | ICD-10-CM | POA: Diagnosis not present

## 2020-10-28 DIAGNOSIS — H9222 Otorrhagia, left ear: Secondary | ICD-10-CM | POA: Diagnosis not present

## 2020-10-28 DIAGNOSIS — M0600 Rheumatoid arthritis without rheumatoid factor, unspecified site: Secondary | ICD-10-CM | POA: Diagnosis not present

## 2020-10-28 DIAGNOSIS — Z0000 Encounter for general adult medical examination without abnormal findings: Secondary | ICD-10-CM | POA: Diagnosis not present

## 2020-10-28 DIAGNOSIS — Z23 Encounter for immunization: Secondary | ICD-10-CM | POA: Diagnosis not present

## 2020-11-01 DIAGNOSIS — Z6827 Body mass index (BMI) 27.0-27.9, adult: Secondary | ICD-10-CM | POA: Diagnosis not present

## 2020-11-01 DIAGNOSIS — H9193 Unspecified hearing loss, bilateral: Secondary | ICD-10-CM | POA: Diagnosis not present

## 2020-11-01 DIAGNOSIS — H60509 Unspecified acute noninfective otitis externa, unspecified ear: Secondary | ICD-10-CM | POA: Diagnosis not present

## 2020-11-01 DIAGNOSIS — D649 Anemia, unspecified: Secondary | ICD-10-CM | POA: Diagnosis not present

## 2020-11-01 DIAGNOSIS — H9202 Otalgia, left ear: Secondary | ICD-10-CM | POA: Diagnosis not present

## 2020-11-12 DIAGNOSIS — H906 Mixed conductive and sensorineural hearing loss, bilateral: Secondary | ICD-10-CM | POA: Diagnosis not present

## 2020-11-12 DIAGNOSIS — Z974 Presence of external hearing-aid: Secondary | ICD-10-CM | POA: Diagnosis not present

## 2020-12-14 DIAGNOSIS — H906 Mixed conductive and sensorineural hearing loss, bilateral: Secondary | ICD-10-CM | POA: Diagnosis not present

## 2020-12-14 DIAGNOSIS — Z9622 Myringotomy tube(s) status: Secondary | ICD-10-CM | POA: Diagnosis not present

## 2021-02-02 DIAGNOSIS — Z4800 Encounter for change or removal of nonsurgical wound dressing: Secondary | ICD-10-CM | POA: Diagnosis not present

## 2021-02-02 DIAGNOSIS — S80812A Abrasion, left lower leg, initial encounter: Secondary | ICD-10-CM | POA: Diagnosis not present

## 2021-02-02 DIAGNOSIS — S81801A Unspecified open wound, right lower leg, initial encounter: Secondary | ICD-10-CM | POA: Diagnosis not present

## 2021-02-02 DIAGNOSIS — L89891 Pressure ulcer of other site, stage 1: Secondary | ICD-10-CM | POA: Diagnosis not present

## 2021-03-11 DIAGNOSIS — W19XXXA Unspecified fall, initial encounter: Secondary | ICD-10-CM | POA: Diagnosis not present

## 2021-03-11 DIAGNOSIS — S81801D Unspecified open wound, right lower leg, subsequent encounter: Secondary | ICD-10-CM | POA: Diagnosis not present

## 2021-03-11 DIAGNOSIS — M549 Dorsalgia, unspecified: Secondary | ICD-10-CM | POA: Diagnosis not present

## 2021-03-12 DIAGNOSIS — M4856XA Collapsed vertebra, not elsewhere classified, lumbar region, initial encounter for fracture: Secondary | ICD-10-CM | POA: Diagnosis not present

## 2021-03-12 DIAGNOSIS — Z96651 Presence of right artificial knee joint: Secondary | ICD-10-CM | POA: Diagnosis not present

## 2021-03-12 DIAGNOSIS — M47816 Spondylosis without myelopathy or radiculopathy, lumbar region: Secondary | ICD-10-CM | POA: Diagnosis not present

## 2021-03-12 DIAGNOSIS — M4854XA Collapsed vertebra, not elsewhere classified, thoracic region, initial encounter for fracture: Secondary | ICD-10-CM | POA: Diagnosis not present

## 2021-03-12 DIAGNOSIS — M1611 Unilateral primary osteoarthritis, right hip: Secondary | ICD-10-CM | POA: Diagnosis not present

## 2021-03-12 DIAGNOSIS — M19071 Primary osteoarthritis, right ankle and foot: Secondary | ICD-10-CM | POA: Diagnosis not present

## 2021-03-12 DIAGNOSIS — M2011 Hallux valgus (acquired), right foot: Secondary | ICD-10-CM | POA: Diagnosis not present

## 2021-03-12 DIAGNOSIS — M47814 Spondylosis without myelopathy or radiculopathy, thoracic region: Secondary | ICD-10-CM | POA: Diagnosis not present

## 2021-03-14 DIAGNOSIS — Z9181 History of falling: Secondary | ICD-10-CM | POA: Diagnosis not present

## 2021-03-14 DIAGNOSIS — S81801D Unspecified open wound, right lower leg, subsequent encounter: Secondary | ICD-10-CM | POA: Diagnosis not present

## 2021-03-14 DIAGNOSIS — Z7689 Persons encountering health services in other specified circumstances: Secondary | ICD-10-CM | POA: Diagnosis not present

## 2021-03-14 DIAGNOSIS — M549 Dorsalgia, unspecified: Secondary | ICD-10-CM | POA: Diagnosis not present

## 2021-03-14 DIAGNOSIS — M6281 Muscle weakness (generalized): Secondary | ICD-10-CM | POA: Diagnosis not present

## 2021-03-15 DIAGNOSIS — I2510 Atherosclerotic heart disease of native coronary artery without angina pectoris: Secondary | ICD-10-CM | POA: Diagnosis not present

## 2021-03-15 DIAGNOSIS — E7800 Pure hypercholesterolemia, unspecified: Secondary | ICD-10-CM | POA: Diagnosis not present

## 2021-03-15 DIAGNOSIS — D649 Anemia, unspecified: Secondary | ICD-10-CM | POA: Diagnosis not present

## 2021-03-15 DIAGNOSIS — E569 Vitamin deficiency, unspecified: Secondary | ICD-10-CM | POA: Diagnosis not present

## 2021-03-16 DIAGNOSIS — M549 Dorsalgia, unspecified: Secondary | ICD-10-CM | POA: Diagnosis not present

## 2021-03-16 DIAGNOSIS — Z7689 Persons encountering health services in other specified circumstances: Secondary | ICD-10-CM | POA: Diagnosis not present

## 2021-03-16 DIAGNOSIS — R944 Abnormal results of kidney function studies: Secondary | ICD-10-CM | POA: Diagnosis not present

## 2021-03-16 DIAGNOSIS — E860 Dehydration: Secondary | ICD-10-CM | POA: Diagnosis not present

## 2021-03-16 DIAGNOSIS — M1990 Unspecified osteoarthritis, unspecified site: Secondary | ICD-10-CM | POA: Diagnosis not present

## 2021-03-16 DIAGNOSIS — M069 Rheumatoid arthritis, unspecified: Secondary | ICD-10-CM | POA: Diagnosis not present

## 2021-03-16 DIAGNOSIS — Z8719 Personal history of other diseases of the digestive system: Secondary | ICD-10-CM | POA: Diagnosis not present

## 2021-03-21 DIAGNOSIS — L244 Irritant contact dermatitis due to drugs in contact with skin: Secondary | ICD-10-CM | POA: Diagnosis not present

## 2021-03-21 DIAGNOSIS — Z7189 Other specified counseling: Secondary | ICD-10-CM | POA: Diagnosis not present

## 2021-03-21 DIAGNOSIS — S81801D Unspecified open wound, right lower leg, subsequent encounter: Secondary | ICD-10-CM | POA: Diagnosis not present

## 2021-03-21 DIAGNOSIS — M549 Dorsalgia, unspecified: Secondary | ICD-10-CM | POA: Diagnosis not present

## 2021-03-22 DIAGNOSIS — S81801D Unspecified open wound, right lower leg, subsequent encounter: Secondary | ICD-10-CM | POA: Diagnosis not present

## 2021-03-22 DIAGNOSIS — M6281 Muscle weakness (generalized): Secondary | ICD-10-CM | POA: Diagnosis not present

## 2021-03-22 DIAGNOSIS — I10 Essential (primary) hypertension: Secondary | ICD-10-CM | POA: Diagnosis not present

## 2021-03-22 DIAGNOSIS — K219 Gastro-esophageal reflux disease without esophagitis: Secondary | ICD-10-CM | POA: Diagnosis not present

## 2021-03-22 DIAGNOSIS — M1990 Unspecified osteoarthritis, unspecified site: Secondary | ICD-10-CM | POA: Diagnosis not present

## 2021-03-22 DIAGNOSIS — Z9181 History of falling: Secondary | ICD-10-CM | POA: Diagnosis not present

## 2021-03-22 DIAGNOSIS — D649 Anemia, unspecified: Secondary | ICD-10-CM | POA: Diagnosis not present

## 2021-03-22 DIAGNOSIS — Z7952 Long term (current) use of systemic steroids: Secondary | ICD-10-CM | POA: Diagnosis not present

## 2021-03-22 DIAGNOSIS — Z79891 Long term (current) use of opiate analgesic: Secondary | ICD-10-CM | POA: Diagnosis not present

## 2021-03-22 DIAGNOSIS — M069 Rheumatoid arthritis, unspecified: Secondary | ICD-10-CM | POA: Diagnosis not present

## 2021-03-22 DIAGNOSIS — E785 Hyperlipidemia, unspecified: Secondary | ICD-10-CM | POA: Diagnosis not present

## 2021-03-22 DIAGNOSIS — M5432 Sciatica, left side: Secondary | ICD-10-CM | POA: Diagnosis not present

## 2021-03-24 DIAGNOSIS — D649 Anemia, unspecified: Secondary | ICD-10-CM | POA: Diagnosis not present

## 2021-03-24 DIAGNOSIS — K219 Gastro-esophageal reflux disease without esophagitis: Secondary | ICD-10-CM | POA: Diagnosis not present

## 2021-03-24 DIAGNOSIS — S81801D Unspecified open wound, right lower leg, subsequent encounter: Secondary | ICD-10-CM | POA: Diagnosis not present

## 2021-03-24 DIAGNOSIS — I10 Essential (primary) hypertension: Secondary | ICD-10-CM | POA: Diagnosis not present

## 2021-03-24 DIAGNOSIS — M069 Rheumatoid arthritis, unspecified: Secondary | ICD-10-CM | POA: Diagnosis not present

## 2021-03-24 DIAGNOSIS — E785 Hyperlipidemia, unspecified: Secondary | ICD-10-CM | POA: Diagnosis not present

## 2021-03-25 DIAGNOSIS — L244 Irritant contact dermatitis due to drugs in contact with skin: Secondary | ICD-10-CM | POA: Diagnosis not present

## 2021-03-25 DIAGNOSIS — S81801D Unspecified open wound, right lower leg, subsequent encounter: Secondary | ICD-10-CM | POA: Diagnosis not present

## 2021-03-25 DIAGNOSIS — M549 Dorsalgia, unspecified: Secondary | ICD-10-CM | POA: Diagnosis not present

## 2021-03-29 DIAGNOSIS — S81801D Unspecified open wound, right lower leg, subsequent encounter: Secondary | ICD-10-CM | POA: Diagnosis not present

## 2021-03-29 DIAGNOSIS — M069 Rheumatoid arthritis, unspecified: Secondary | ICD-10-CM | POA: Diagnosis not present

## 2021-03-29 DIAGNOSIS — K5900 Constipation, unspecified: Secondary | ICD-10-CM | POA: Diagnosis not present

## 2021-03-29 DIAGNOSIS — K219 Gastro-esophageal reflux disease without esophagitis: Secondary | ICD-10-CM | POA: Diagnosis not present

## 2021-03-29 DIAGNOSIS — D649 Anemia, unspecified: Secondary | ICD-10-CM | POA: Diagnosis not present

## 2021-03-29 DIAGNOSIS — E785 Hyperlipidemia, unspecified: Secondary | ICD-10-CM | POA: Diagnosis not present

## 2021-03-29 DIAGNOSIS — I10 Essential (primary) hypertension: Secondary | ICD-10-CM | POA: Diagnosis not present

## 2021-03-29 DIAGNOSIS — R52 Pain, unspecified: Secondary | ICD-10-CM | POA: Diagnosis not present

## 2021-03-31 DIAGNOSIS — M069 Rheumatoid arthritis, unspecified: Secondary | ICD-10-CM | POA: Diagnosis not present

## 2021-03-31 DIAGNOSIS — I10 Essential (primary) hypertension: Secondary | ICD-10-CM | POA: Diagnosis not present

## 2021-03-31 DIAGNOSIS — K219 Gastro-esophageal reflux disease without esophagitis: Secondary | ICD-10-CM | POA: Diagnosis not present

## 2021-03-31 DIAGNOSIS — K5900 Constipation, unspecified: Secondary | ICD-10-CM | POA: Diagnosis not present

## 2021-03-31 DIAGNOSIS — S81801A Unspecified open wound, right lower leg, initial encounter: Secondary | ICD-10-CM | POA: Diagnosis not present

## 2021-03-31 DIAGNOSIS — E785 Hyperlipidemia, unspecified: Secondary | ICD-10-CM | POA: Diagnosis not present

## 2021-03-31 DIAGNOSIS — L089 Local infection of the skin and subcutaneous tissue, unspecified: Secondary | ICD-10-CM | POA: Diagnosis not present

## 2021-03-31 DIAGNOSIS — S81801D Unspecified open wound, right lower leg, subsequent encounter: Secondary | ICD-10-CM | POA: Diagnosis not present

## 2021-03-31 DIAGNOSIS — D649 Anemia, unspecified: Secondary | ICD-10-CM | POA: Diagnosis not present

## 2021-04-04 ENCOUNTER — Other Ambulatory Visit: Payer: Self-pay

## 2021-04-04 ENCOUNTER — Encounter (HOSPITAL_COMMUNITY): Payer: Self-pay | Admitting: Oncology

## 2021-04-04 ENCOUNTER — Emergency Department (HOSPITAL_COMMUNITY): Payer: Medicare Other

## 2021-04-04 ENCOUNTER — Emergency Department (HOSPITAL_COMMUNITY)
Admission: EM | Admit: 2021-04-04 | Discharge: 2021-04-04 | Disposition: A | Discharge status: Skilled Nursing Facility | Payer: Medicare Other | Attending: Emergency Medicine | Admitting: Emergency Medicine

## 2021-04-04 DIAGNOSIS — I700 Atherosclerosis of aorta: Secondary | ICD-10-CM | POA: Diagnosis not present

## 2021-04-04 DIAGNOSIS — R109 Unspecified abdominal pain: Secondary | ICD-10-CM | POA: Diagnosis present

## 2021-04-04 DIAGNOSIS — R0902 Hypoxemia: Secondary | ICD-10-CM | POA: Diagnosis not present

## 2021-04-04 DIAGNOSIS — R1084 Generalized abdominal pain: Secondary | ICD-10-CM | POA: Diagnosis not present

## 2021-04-04 DIAGNOSIS — Z79899 Other long term (current) drug therapy: Secondary | ICD-10-CM | POA: Insufficient documentation

## 2021-04-04 DIAGNOSIS — I4719 Other supraventricular tachycardia: Secondary | ICD-10-CM

## 2021-04-04 DIAGNOSIS — N8500 Endometrial hyperplasia, unspecified: Secondary | ICD-10-CM | POA: Diagnosis not present

## 2021-04-04 DIAGNOSIS — R1030 Lower abdominal pain, unspecified: Secondary | ICD-10-CM | POA: Diagnosis not present

## 2021-04-04 DIAGNOSIS — R000 Tachycardia, unspecified: Secondary | ICD-10-CM | POA: Diagnosis not present

## 2021-04-04 DIAGNOSIS — K5903 Drug induced constipation: Secondary | ICD-10-CM | POA: Diagnosis not present

## 2021-04-04 DIAGNOSIS — Z87891 Personal history of nicotine dependence: Secondary | ICD-10-CM | POA: Diagnosis not present

## 2021-04-04 DIAGNOSIS — I10 Essential (primary) hypertension: Secondary | ICD-10-CM | POA: Diagnosis not present

## 2021-04-04 DIAGNOSIS — N939 Abnormal uterine and vaginal bleeding, unspecified: Secondary | ICD-10-CM | POA: Diagnosis not present

## 2021-04-04 DIAGNOSIS — N281 Cyst of kidney, acquired: Secondary | ICD-10-CM | POA: Diagnosis not present

## 2021-04-04 DIAGNOSIS — Z743 Need for continuous supervision: Secondary | ICD-10-CM | POA: Diagnosis not present

## 2021-04-04 DIAGNOSIS — R9389 Abnormal findings on diagnostic imaging of other specified body structures: Secondary | ICD-10-CM

## 2021-04-04 DIAGNOSIS — I471 Supraventricular tachycardia: Secondary | ICD-10-CM

## 2021-04-04 DIAGNOSIS — R58 Hemorrhage, not elsewhere classified: Secondary | ICD-10-CM | POA: Diagnosis not present

## 2021-04-04 LAB — CBC WITH DIFFERENTIAL/PLATELET
Abs Immature Granulocytes: 0.04 10*3/uL (ref 0.00–0.07)
Basophils Absolute: 0 10*3/uL (ref 0.0–0.1)
Basophils Relative: 0 %
Eosinophils Absolute: 0.1 10*3/uL (ref 0.0–0.5)
Eosinophils Relative: 1 %
HCT: 37.7 % (ref 36.0–46.0)
Hemoglobin: 12.7 g/dL (ref 12.0–15.0)
Immature Granulocytes: 1 %
Lymphocytes Relative: 11 %
Lymphs Abs: 0.9 10*3/uL (ref 0.7–4.0)
MCH: 35.4 pg — ABNORMAL HIGH (ref 26.0–34.0)
MCHC: 33.7 g/dL (ref 30.0–36.0)
MCV: 105 fL — ABNORMAL HIGH (ref 80.0–100.0)
Monocytes Absolute: 0.8 10*3/uL (ref 0.1–1.0)
Monocytes Relative: 10 %
Neutro Abs: 6.4 10*3/uL (ref 1.7–7.7)
Neutrophils Relative %: 77 %
Platelets: 394 10*3/uL (ref 150–400)
RBC: 3.59 MIL/uL — ABNORMAL LOW (ref 3.87–5.11)
RDW: 20.4 % — ABNORMAL HIGH (ref 11.5–15.5)
WBC: 8.3 10*3/uL (ref 4.0–10.5)
nRBC: 0.2 % (ref 0.0–0.2)

## 2021-04-04 LAB — URINALYSIS, ROUTINE W REFLEX MICROSCOPIC
Bilirubin Urine: NEGATIVE
Glucose, UA: NEGATIVE mg/dL
Ketones, ur: 40 mg/dL — AB
Leukocytes,Ua: NEGATIVE
Nitrite: NEGATIVE
Specific Gravity, Urine: 1.01 (ref 1.005–1.030)
pH: 7.5 (ref 5.0–8.0)

## 2021-04-04 LAB — COMPREHENSIVE METABOLIC PANEL
ALT: 15 U/L (ref 0–44)
AST: 22 U/L (ref 15–41)
Albumin: 3.7 g/dL (ref 3.5–5.0)
Alkaline Phosphatase: 88 U/L (ref 38–126)
Anion gap: 9 (ref 5–15)
BUN: 23 mg/dL (ref 8–23)
CO2: 26 mmol/L (ref 22–32)
Calcium: 8.8 mg/dL — ABNORMAL LOW (ref 8.9–10.3)
Chloride: 102 mmol/L (ref 98–111)
Creatinine, Ser: 0.81 mg/dL (ref 0.44–1.00)
GFR, Estimated: >60 mL/min (ref >60)
Glucose, Bld: 104 mg/dL — ABNORMAL HIGH (ref 70–99)
Potassium: 3.8 mmol/L (ref 3.5–5.1)
Sodium: 137 mmol/L (ref 135–145)
Total Bilirubin: 1.4 mg/dL — ABNORMAL HIGH (ref 0.3–1.2)
Total Protein: 6.4 g/dL — ABNORMAL LOW (ref 6.5–8.1)

## 2021-04-04 LAB — LIPASE, BLOOD: Lipase: 37 U/L (ref 11–51)

## 2021-04-04 MED ORDER — ONDANSETRON HCL 4 MG/2ML IJ SOLN
4.0000 mg | Freq: Once | INTRAMUSCULAR | Status: AC
  Administered 2021-04-04: 4 mg via INTRAVENOUS
  Filled 2021-04-04: qty 2

## 2021-04-04 MED ORDER — FENTANYL CITRATE PF 50 MCG/ML IJ SOSY
25.0000 ug | PREFILLED_SYRINGE | Freq: Once | INTRAMUSCULAR | Status: AC
  Administered 2021-04-04: 25 ug via INTRAVENOUS
  Filled 2021-04-04: qty 1

## 2021-04-04 MED ORDER — LACTATED RINGERS IV SOLN: INTRAVENOUS | Status: DC

## 2021-04-04 MED ORDER — METOPROLOL SUCCINATE ER 25 MG PO TB24: 12.5000 mg | ORAL_TABLET | Freq: Every day | ORAL | 0 refills | Status: AC

## 2021-04-04 MED ORDER — LACTATED RINGERS IV BOLUS
1000.0000 mL | Freq: Once | INTRAVENOUS | Status: AC
  Administered 2021-04-04: 1000 mL via INTRAVENOUS

## 2021-04-04 MED ORDER — METOPROLOL TARTRATE 5 MG/5ML IV SOLN
5.0000 mg | Freq: Once | INTRAVENOUS | Status: AC
  Administered 2021-04-04: 5 mg via INTRAVENOUS
  Filled 2021-04-04: qty 5

## 2021-04-04 MED ORDER — IOHEXOL 350 MG/ML SOLN
80.0000 mL | Freq: Once | INTRAVENOUS | Status: AC | PRN
  Administered 2021-04-04: 80 mL via INTRAVENOUS

## 2021-04-04 MED ORDER — FENTANYL CITRATE PF 50 MCG/ML IJ SOSY
50.0000 ug | PREFILLED_SYRINGE | Freq: Once | INTRAMUSCULAR | Status: AC
  Administered 2021-04-04: 50 ug via INTRAVENOUS
  Filled 2021-04-04: qty 1

## 2021-04-04 MED ORDER — SODIUM CHLORIDE 0.9 % IV SOLN: INTRAVENOUS | Status: DC

## 2021-04-04 NOTE — ED Provider Notes (Addendum)
[facility] DEPT Provider Note   CSN: [id number] Arrival date & time: 04/04/21  5784     History Chief Complaint  Patient presents with   Abdominal Pain    [name] is a 87 y.o. female.  Patient presents with lower abdominal discomfort which is bilateral x1 day.  No associated fever, vomiting, diarrhea.  Pain is characterizes sharp.  Denies any associated urinary complaints.  Does radiate to her flanks.  Has also noticed some vaginal bleeding this morning.  No treatment use prior to arrival      Past Medical History:  Diagnosis Date   Hypertension     Patient Active Problem List   Diagnosis Date Noted   GI bleed 02/12/2019   High blood pressure 01/15/2012    Past Surgical History:  Procedure Laterality Date   APPENDECTOMY     BREAST BIOPSY     HERNIA REPAIR     REPLACEMENT TOTAL KNEE BILATERAL       OB History   No obstetric history on file.     Family History  Problem Relation Age of Onset   Cancer Mother    Stroke Father    Hypertension Father     Social History   Tobacco Use   Smoking status: Former   Smokeless tobacco: Never  Scientific laboratory technician Use: Never used  Substance Use Topics   Alcohol use: No   Drug use: No    Home Medications Prior to Admission medications   Medication Sig Start Date End Date Taking? Authorizing Provider  acetaminophen (TYLENOL) 500 MG tablet Take 500 mg by mouth every 6 (six) hours as needed for mild pain.     [provider]  albuterol (PROVENTIL HFA;VENTOLIN HFA) 108 (90 BASE) MCG/ACT inhaler Inhale into the lungs every 6 (six) hours as needed for wheezing or shortness of breath.    [provider]  calcium carbonate (OS-CAL) 600 MG TABS Take 600 mg by mouth daily.    [provider]  nitroGLYCERIN (NITROSTAT) 0.4 MG SL tablet Place 0.4 mg under the tongue every 5 (five) minutes as needed for chest pain.    [provider]   pantoprazole (PROTONIX) 40 MG tablet Take 1 tablet (40 mg total) by mouth 2 (two) times daily. 02/15/19   Vena Rua, PA-C  predniSONE (DELTASONE) 2.5 MG tablet Take 2.5 mg by mouth daily with breakfast.    [provider]  Probiotic Product (PROBIOTIC DAILY PO) Take by mouth.    [provider]  vitamin C (ASCORBIC ACID) 500 MG tablet Take 500 mg by mouth daily.    [provider]    Allergies    Clinoril [sulindac], Doxycycline, Ketoconazole, Naprosyn [naproxen], Neosporin [neomycin-bacitracin zn-polymyx], Voltaren [diclofenac sodium], Amoxicillin, and Penicillins  Review of Systems   Review of Systems  All other systems reviewed and are negative.  Physical Exam Updated Vital Signs BP (!) 179/90 (BP Location: Left Arm)    Pulse 81    Temp 98.8 F (37.1 C) (Oral)    Resp 20    SpO2 96%   Physical Exam Vitals and nursing note reviewed.  Constitutional:      General: She is not in acute distress.    Appearance: Normal appearance. She is well-developed. She is not toxic-appearing.  HENT:     Head: Normocephalic and atraumatic.  Eyes:     General: Lids are normal.     Conjunctiva/sclera: Conjunctivae normal.  Pupils: Pupils are equal, round, and reactive to light.  Neck:     Thyroid: No thyroid mass.     Trachea: No tracheal deviation.  Cardiovascular:     Rate and Rhythm: Normal rate and regular rhythm.     Heart sounds: Normal heart sounds. No murmur heard.   No gallop.  Pulmonary:     Effort: Pulmonary effort is normal. No respiratory distress.     Breath sounds: Normal breath sounds. No stridor. No decreased breath sounds, wheezing, rhonchi or rales.  Abdominal:     General: There is no distension.     Palpations: Abdomen is soft.     Tenderness: There is no abdominal tenderness. There is no rebound.    Genitourinary:    Comments: No evidence of active vaginal bleeding appreciated.  Some pink-tinged stains noted in patient's  diaper Musculoskeletal:        General: No tenderness. Normal range of motion.     Cervical back: Normal range of motion and neck supple.  Skin:    General: Skin is warm and dry.     Findings: No abrasion or rash.  Neurological:     Mental Status: She is alert and oriented to person, place, and time. Mental status is at baseline.     GCS: GCS eye subscore is 4. GCS verbal subscore is 5. GCS motor subscore is 6.     Cranial Nerves: No cranial nerve deficit.     Sensory: No sensory deficit.     Motor: Motor function is intact.  Psychiatric:        Attention and Perception: Attention normal.        Speech: Speech normal.        Behavior: Behavior normal.    ED Results / Procedures / Treatments   Labs (all labs ordered are listed, but only abnormal results are displayed) Labs Reviewed - No data to display  EKG None  Radiology No results found.  Procedures Procedures   Medications Ordered in ED Medications  0.9 %  sodium chloride infusion (has no administration in time range)    ED Course  I have reviewed the triage vital signs and the nursing notes.  Pertinent labs & imaging results that were available during my care of the patient were reviewed by me and considered in my medical decision making (see chart for details).  Clinical Course as of 04/04/21 1543  Mon Apr 04, 2021  1541 Pending fluids and urine + enema, random epsiodes of rapid tachycardia [MK]    Clinical Course User Index [MK] Kommor, Madison, MD   MDM Rules/Calculators/A&P                         Patient medicated for abdominal discomfort x2.  Abdominal CT shows mild constipation.  Also noted increased endometrial stripe consistent with patient's known history of vaginal bleeding.  Concern for possible endometrial pregnancy discussed with patient as well as with daughter.  They state that they will follow-up with her doctor for this.  Hemoglobin is stable at 12.7.  Patient has noted to have some continued  abdominal cramping.  Urinalysis is not resulted yet.  Plan will be to give patient IV hydration and check urine.  Will monitor here and I will sign out to Dr. Tinnie Gens    Final Clinical Impression(s) / ED Diagnoses Final diagnoses:  None    Rx / DC Orders ED Discharge Orders     None  Lacretia Leigh, MD 04/04/21 1531    Lacretia Leigh, MD 04/04/21 [phone]

## 2021-04-04 NOTE — ED Provider Notes (Signed)
°  Physical Exam  BP (!) 156/88    Pulse 80    Temp 98.8 F (37.1 C) (Oral)    Resp 18    SpO2 94%   Physical Exam Vitals and nursing note reviewed.  Constitutional:      General: She is not in acute distress.    Appearance: She is well-developed.  HENT:     Head: Normocephalic and atraumatic.  Eyes:     Conjunctiva/sclera: Conjunctivae normal.  Cardiovascular:     Rate and Rhythm: Normal rate and regular rhythm.     Heart sounds: No murmur heard. Pulmonary:     Effort: Pulmonary effort is normal. No respiratory distress.     Breath sounds: Normal breath sounds.  Abdominal:     Palpations: Abdomen is soft.     Tenderness: There is no abdominal tenderness.  Musculoskeletal:        General: No swelling.     Cervical back: Neck supple.  Skin:    General: Skin is warm and dry.     Capillary Refill: Capillary refill takes less than 2 seconds.  Neurological:     Mental Status: She is alert.  Psychiatric:        Mood and Affect: Mood normal.    ED Course/Procedures   Clinical Course as of 04/04/21 2302  Mon Apr 04, 2021  1541 Pending fluids and urine + enema, random epsiodes of rapid tachycardia [MK]    Clinical Course User Index [MK] Kerim Statzer, MD    Procedures  MDM  Patient received in handoff.  Patient with multiple complaints including abdominal pain, vaginal bleeding, constipation and tachycardia.  Patient's tachycardia appears to be runs of atrial tachycardia she was given 5 mg Lopressor IV which led to cessation of this rhythm.  Enema received in the emergency department with successful removal of stool.  Urinalysis is too dirty to interpret with multiple squamous epithelial cells, 11-20 white blood cells, 11-20 red blood cells but no positive nitrates or leukocyte esterase.  We will hold antibiotics and follow-up on urine cultures.  Patient then discharged back to her facility with a prescription for 12.5 Toprol and need for outpatient follow-up in regards to her  thickened endometrial stripe with OB/GYN and cardiology for her atrial tachycardia.       Teressa Lower, MD 04/04/21 [phone]

## 2021-04-04 NOTE — ED Notes (Signed)
Enema given yielding large stool return.  Pt cleaned, clothes put back on and placed in wheelchair for d/c back to SNF.  Family to transport pt back.

## 2021-04-04 NOTE — ED Triage Notes (Signed)
Pt bib [facility] from [facility] d/t lower abdominal pain that wraps around to her back and vaginal bleeding that began this morning.    EMS VS 170/72 90 96% 97.9

## 2021-04-04 NOTE — ED Notes (Signed)
Pt's O2 level noted to be 85% on RA.  2L supplemental O2 placed on pt w/ improvement in O2 sats.

## 2021-04-04 NOTE — Discharge Instructions (Signed)
You were seen in the emergency department for evaluation of multiple complaints including vaginal bleeding and constipation.  An enema was performed leading to evacuation of the hard stool ball.  You can continue to take MiraLAX to facilitate the rest of the stool removal.  Your CAT scan today is showing an increased thickness in the uterus that may be related to developing endometrial cancer and this should be evaluated by an OB/GYN if you deem that this is consistent with her goals of care.  She also has intermittent runs of a fast heart rate called atrial tachycardia.  This can be controlled by starting a low-dose beta-blocker today and this was sent to the [facility].  At this time she is safe for discharge and please follow-up with OB/GYN as well as cardiology for her abnormal heart rate.

## 2021-04-05 DIAGNOSIS — K5900 Constipation, unspecified: Secondary | ICD-10-CM | POA: Diagnosis not present

## 2021-04-05 DIAGNOSIS — K219 Gastro-esophageal reflux disease without esophagitis: Secondary | ICD-10-CM | POA: Diagnosis not present

## 2021-04-05 DIAGNOSIS — E785 Hyperlipidemia, unspecified: Secondary | ICD-10-CM | POA: Diagnosis not present

## 2021-04-05 DIAGNOSIS — R52 Pain, unspecified: Secondary | ICD-10-CM | POA: Diagnosis not present

## 2021-04-05 DIAGNOSIS — S81801D Unspecified open wound, right lower leg, subsequent encounter: Secondary | ICD-10-CM | POA: Diagnosis not present

## 2021-04-05 DIAGNOSIS — M069 Rheumatoid arthritis, unspecified: Secondary | ICD-10-CM | POA: Diagnosis not present

## 2021-04-05 DIAGNOSIS — D649 Anemia, unspecified: Secondary | ICD-10-CM | POA: Diagnosis not present

## 2021-04-05 DIAGNOSIS — I10 Essential (primary) hypertension: Secondary | ICD-10-CM | POA: Diagnosis not present

## 2021-04-06 DIAGNOSIS — K644 Residual hemorrhoidal skin tags: Secondary | ICD-10-CM | POA: Diagnosis not present

## 2021-04-06 DIAGNOSIS — N939 Abnormal uterine and vaginal bleeding, unspecified: Secondary | ICD-10-CM | POA: Diagnosis not present

## 2021-04-06 DIAGNOSIS — R109 Unspecified abdominal pain: Secondary | ICD-10-CM | POA: Diagnosis not present

## 2021-04-06 DIAGNOSIS — Z8679 Personal history of other diseases of the circulatory system: Secondary | ICD-10-CM | POA: Diagnosis not present

## 2021-04-06 DIAGNOSIS — K5900 Constipation, unspecified: Secondary | ICD-10-CM | POA: Diagnosis not present

## 2021-04-07 DIAGNOSIS — E785 Hyperlipidemia, unspecified: Secondary | ICD-10-CM | POA: Diagnosis not present

## 2021-04-07 DIAGNOSIS — D649 Anemia, unspecified: Secondary | ICD-10-CM | POA: Diagnosis not present

## 2021-04-07 DIAGNOSIS — S81801D Unspecified open wound, right lower leg, subsequent encounter: Secondary | ICD-10-CM | POA: Diagnosis not present

## 2021-04-07 DIAGNOSIS — K219 Gastro-esophageal reflux disease without esophagitis: Secondary | ICD-10-CM | POA: Diagnosis not present

## 2021-04-07 DIAGNOSIS — I10 Essential (primary) hypertension: Secondary | ICD-10-CM | POA: Diagnosis not present

## 2021-04-07 DIAGNOSIS — M069 Rheumatoid arthritis, unspecified: Secondary | ICD-10-CM | POA: Diagnosis not present

## 2021-04-12 DIAGNOSIS — S81801D Unspecified open wound, right lower leg, subsequent encounter: Secondary | ICD-10-CM | POA: Diagnosis not present

## 2021-04-12 DIAGNOSIS — E785 Hyperlipidemia, unspecified: Secondary | ICD-10-CM | POA: Diagnosis not present

## 2021-04-12 DIAGNOSIS — I10 Essential (primary) hypertension: Secondary | ICD-10-CM | POA: Diagnosis not present

## 2021-04-12 DIAGNOSIS — M069 Rheumatoid arthritis, unspecified: Secondary | ICD-10-CM | POA: Diagnosis not present

## 2021-04-12 DIAGNOSIS — K219 Gastro-esophageal reflux disease without esophagitis: Secondary | ICD-10-CM | POA: Diagnosis not present

## 2021-04-12 DIAGNOSIS — D649 Anemia, unspecified: Secondary | ICD-10-CM | POA: Diagnosis not present

## 2021-04-13 DIAGNOSIS — R52 Pain, unspecified: Secondary | ICD-10-CM | POA: Diagnosis not present

## 2021-04-13 DIAGNOSIS — K5900 Constipation, unspecified: Secondary | ICD-10-CM | POA: Diagnosis not present

## 2021-04-13 DIAGNOSIS — M069 Rheumatoid arthritis, unspecified: Secondary | ICD-10-CM | POA: Diagnosis not present

## 2021-04-13 DIAGNOSIS — M1990 Unspecified osteoarthritis, unspecified site: Secondary | ICD-10-CM | POA: Diagnosis not present

## 2021-04-13 DIAGNOSIS — N939 Abnormal uterine and vaginal bleeding, unspecified: Secondary | ICD-10-CM | POA: Diagnosis not present

## 2021-04-14 DIAGNOSIS — I10 Essential (primary) hypertension: Secondary | ICD-10-CM | POA: Diagnosis not present

## 2021-04-14 DIAGNOSIS — K219 Gastro-esophageal reflux disease without esophagitis: Secondary | ICD-10-CM | POA: Diagnosis not present

## 2021-04-14 DIAGNOSIS — S81801D Unspecified open wound, right lower leg, subsequent encounter: Secondary | ICD-10-CM | POA: Diagnosis not present

## 2021-04-14 DIAGNOSIS — E785 Hyperlipidemia, unspecified: Secondary | ICD-10-CM | POA: Diagnosis not present

## 2021-04-14 DIAGNOSIS — D649 Anemia, unspecified: Secondary | ICD-10-CM | POA: Diagnosis not present

## 2021-04-14 DIAGNOSIS — M069 Rheumatoid arthritis, unspecified: Secondary | ICD-10-CM | POA: Diagnosis not present

## 2021-04-18 DIAGNOSIS — S81801D Unspecified open wound, right lower leg, subsequent encounter: Secondary | ICD-10-CM | POA: Diagnosis not present

## 2021-04-18 DIAGNOSIS — D649 Anemia, unspecified: Secondary | ICD-10-CM | POA: Diagnosis not present

## 2021-04-18 DIAGNOSIS — E785 Hyperlipidemia, unspecified: Secondary | ICD-10-CM | POA: Diagnosis not present

## 2021-04-18 DIAGNOSIS — I10 Essential (primary) hypertension: Secondary | ICD-10-CM | POA: Diagnosis not present

## 2021-04-18 DIAGNOSIS — M069 Rheumatoid arthritis, unspecified: Secondary | ICD-10-CM | POA: Diagnosis not present

## 2021-04-18 DIAGNOSIS — K219 Gastro-esophageal reflux disease without esophagitis: Secondary | ICD-10-CM | POA: Diagnosis not present

## 2021-04-19 DIAGNOSIS — S81801D Unspecified open wound, right lower leg, subsequent encounter: Secondary | ICD-10-CM | POA: Diagnosis not present

## 2021-04-19 DIAGNOSIS — R2689 Other abnormalities of gait and mobility: Secondary | ICD-10-CM | POA: Diagnosis not present

## 2021-04-19 DIAGNOSIS — M549 Dorsalgia, unspecified: Secondary | ICD-10-CM | POA: Diagnosis not present

## 2021-04-19 DIAGNOSIS — Z9181 History of falling: Secondary | ICD-10-CM | POA: Diagnosis not present

## 2021-04-19 DIAGNOSIS — M6281 Muscle weakness (generalized): Secondary | ICD-10-CM | POA: Diagnosis not present

## 2021-04-19 DIAGNOSIS — K5900 Constipation, unspecified: Secondary | ICD-10-CM | POA: Diagnosis not present

## 2021-04-19 DIAGNOSIS — R52 Pain, unspecified: Secondary | ICD-10-CM | POA: Diagnosis not present

## 2021-04-20 DIAGNOSIS — D649 Anemia, unspecified: Secondary | ICD-10-CM | POA: Diagnosis not present

## 2021-04-20 DIAGNOSIS — I10 Essential (primary) hypertension: Secondary | ICD-10-CM | POA: Diagnosis not present

## 2021-04-20 DIAGNOSIS — E785 Hyperlipidemia, unspecified: Secondary | ICD-10-CM | POA: Diagnosis not present

## 2021-04-20 DIAGNOSIS — K219 Gastro-esophageal reflux disease without esophagitis: Secondary | ICD-10-CM | POA: Diagnosis not present

## 2021-04-20 DIAGNOSIS — M069 Rheumatoid arthritis, unspecified: Secondary | ICD-10-CM | POA: Diagnosis not present

## 2021-04-20 DIAGNOSIS — S81801D Unspecified open wound, right lower leg, subsequent encounter: Secondary | ICD-10-CM | POA: Diagnosis not present

## 2021-04-21 DIAGNOSIS — S81801D Unspecified open wound, right lower leg, subsequent encounter: Secondary | ICD-10-CM | POA: Diagnosis not present

## 2021-04-21 DIAGNOSIS — I10 Essential (primary) hypertension: Secondary | ICD-10-CM | POA: Diagnosis not present

## 2021-04-21 DIAGNOSIS — M5432 Sciatica, left side: Secondary | ICD-10-CM | POA: Diagnosis not present

## 2021-04-21 DIAGNOSIS — M6281 Muscle weakness (generalized): Secondary | ICD-10-CM | POA: Diagnosis not present

## 2021-04-21 DIAGNOSIS — Z7952 Long term (current) use of systemic steroids: Secondary | ICD-10-CM | POA: Diagnosis not present

## 2021-04-21 DIAGNOSIS — E785 Hyperlipidemia, unspecified: Secondary | ICD-10-CM | POA: Diagnosis not present

## 2021-04-21 DIAGNOSIS — Z9181 History of falling: Secondary | ICD-10-CM | POA: Diagnosis not present

## 2021-04-21 DIAGNOSIS — M069 Rheumatoid arthritis, unspecified: Secondary | ICD-10-CM | POA: Diagnosis not present

## 2021-04-21 DIAGNOSIS — K219 Gastro-esophageal reflux disease without esophagitis: Secondary | ICD-10-CM | POA: Diagnosis not present

## 2021-04-21 DIAGNOSIS — M1990 Unspecified osteoarthritis, unspecified site: Secondary | ICD-10-CM | POA: Diagnosis not present

## 2021-04-21 DIAGNOSIS — Z79891 Long term (current) use of opiate analgesic: Secondary | ICD-10-CM | POA: Diagnosis not present

## 2021-04-21 DIAGNOSIS — D649 Anemia, unspecified: Secondary | ICD-10-CM | POA: Diagnosis not present

## 2021-04-25 DIAGNOSIS — S81801D Unspecified open wound, right lower leg, subsequent encounter: Secondary | ICD-10-CM | POA: Diagnosis not present

## 2021-04-25 DIAGNOSIS — D649 Anemia, unspecified: Secondary | ICD-10-CM | POA: Diagnosis not present

## 2021-04-25 DIAGNOSIS — M069 Rheumatoid arthritis, unspecified: Secondary | ICD-10-CM | POA: Diagnosis not present

## 2021-04-25 DIAGNOSIS — I10 Essential (primary) hypertension: Secondary | ICD-10-CM | POA: Diagnosis not present

## 2021-04-25 DIAGNOSIS — E785 Hyperlipidemia, unspecified: Secondary | ICD-10-CM | POA: Diagnosis not present

## 2021-04-25 DIAGNOSIS — K219 Gastro-esophageal reflux disease without esophagitis: Secondary | ICD-10-CM | POA: Diagnosis not present

## 2021-04-26 DIAGNOSIS — R52 Pain, unspecified: Secondary | ICD-10-CM | POA: Diagnosis not present

## 2021-04-26 DIAGNOSIS — K5900 Constipation, unspecified: Secondary | ICD-10-CM | POA: Diagnosis not present

## 2021-04-28 DIAGNOSIS — I10 Essential (primary) hypertension: Secondary | ICD-10-CM | POA: Diagnosis not present

## 2021-04-28 DIAGNOSIS — K219 Gastro-esophageal reflux disease without esophagitis: Secondary | ICD-10-CM | POA: Diagnosis not present

## 2021-04-28 DIAGNOSIS — D649 Anemia, unspecified: Secondary | ICD-10-CM | POA: Diagnosis not present

## 2021-04-28 DIAGNOSIS — E785 Hyperlipidemia, unspecified: Secondary | ICD-10-CM | POA: Diagnosis not present

## 2021-04-28 DIAGNOSIS — M069 Rheumatoid arthritis, unspecified: Secondary | ICD-10-CM | POA: Diagnosis not present

## 2021-04-28 DIAGNOSIS — S81801D Unspecified open wound, right lower leg, subsequent encounter: Secondary | ICD-10-CM | POA: Diagnosis not present

## 2021-05-02 DIAGNOSIS — M069 Rheumatoid arthritis, unspecified: Secondary | ICD-10-CM | POA: Diagnosis not present

## 2021-05-02 DIAGNOSIS — S81801D Unspecified open wound, right lower leg, subsequent encounter: Secondary | ICD-10-CM | POA: Diagnosis not present

## 2021-05-02 DIAGNOSIS — I10 Essential (primary) hypertension: Secondary | ICD-10-CM | POA: Diagnosis not present

## 2021-05-02 DIAGNOSIS — E785 Hyperlipidemia, unspecified: Secondary | ICD-10-CM | POA: Diagnosis not present

## 2021-05-02 DIAGNOSIS — K219 Gastro-esophageal reflux disease without esophagitis: Secondary | ICD-10-CM | POA: Diagnosis not present

## 2021-05-02 DIAGNOSIS — D649 Anemia, unspecified: Secondary | ICD-10-CM | POA: Diagnosis not present

## 2021-05-04 DIAGNOSIS — M069 Rheumatoid arthritis, unspecified: Secondary | ICD-10-CM | POA: Diagnosis not present

## 2021-05-04 DIAGNOSIS — I10 Essential (primary) hypertension: Secondary | ICD-10-CM | POA: Diagnosis not present

## 2021-05-04 DIAGNOSIS — S81801D Unspecified open wound, right lower leg, subsequent encounter: Secondary | ICD-10-CM | POA: Diagnosis not present

## 2021-05-04 DIAGNOSIS — D649 Anemia, unspecified: Secondary | ICD-10-CM | POA: Diagnosis not present

## 2021-05-04 DIAGNOSIS — E785 Hyperlipidemia, unspecified: Secondary | ICD-10-CM | POA: Diagnosis not present

## 2021-05-04 DIAGNOSIS — K219 Gastro-esophageal reflux disease without esophagitis: Secondary | ICD-10-CM | POA: Diagnosis not present

## 2021-05-09 DIAGNOSIS — K219 Gastro-esophageal reflux disease without esophagitis: Secondary | ICD-10-CM | POA: Diagnosis not present

## 2021-05-09 DIAGNOSIS — S81801D Unspecified open wound, right lower leg, subsequent encounter: Secondary | ICD-10-CM | POA: Diagnosis not present

## 2021-05-09 DIAGNOSIS — I10 Essential (primary) hypertension: Secondary | ICD-10-CM | POA: Diagnosis not present

## 2021-05-09 DIAGNOSIS — E785 Hyperlipidemia, unspecified: Secondary | ICD-10-CM | POA: Diagnosis not present

## 2021-05-09 DIAGNOSIS — D649 Anemia, unspecified: Secondary | ICD-10-CM | POA: Diagnosis not present

## 2021-05-09 DIAGNOSIS — M069 Rheumatoid arthritis, unspecified: Secondary | ICD-10-CM | POA: Diagnosis not present

## 2021-05-10 DIAGNOSIS — R5383 Other fatigue: Secondary | ICD-10-CM | POA: Diagnosis not present

## 2021-05-10 DIAGNOSIS — K5900 Constipation, unspecified: Secondary | ICD-10-CM | POA: Diagnosis not present

## 2021-05-10 DIAGNOSIS — R52 Pain, unspecified: Secondary | ICD-10-CM | POA: Diagnosis not present

## 2021-05-11 DIAGNOSIS — K219 Gastro-esophageal reflux disease without esophagitis: Secondary | ICD-10-CM | POA: Diagnosis not present

## 2021-05-11 DIAGNOSIS — E785 Hyperlipidemia, unspecified: Secondary | ICD-10-CM | POA: Diagnosis not present

## 2021-05-11 DIAGNOSIS — M069 Rheumatoid arthritis, unspecified: Secondary | ICD-10-CM | POA: Diagnosis not present

## 2021-05-11 DIAGNOSIS — I10 Essential (primary) hypertension: Secondary | ICD-10-CM | POA: Diagnosis not present

## 2021-05-11 DIAGNOSIS — S81801D Unspecified open wound, right lower leg, subsequent encounter: Secondary | ICD-10-CM | POA: Diagnosis not present

## 2021-05-11 DIAGNOSIS — D649 Anemia, unspecified: Secondary | ICD-10-CM | POA: Diagnosis not present

## 2021-05-16 DIAGNOSIS — S81801D Unspecified open wound, right lower leg, subsequent encounter: Secondary | ICD-10-CM | POA: Diagnosis not present

## 2021-05-16 DIAGNOSIS — M069 Rheumatoid arthritis, unspecified: Secondary | ICD-10-CM | POA: Diagnosis not present

## 2021-05-16 DIAGNOSIS — E785 Hyperlipidemia, unspecified: Secondary | ICD-10-CM | POA: Diagnosis not present

## 2021-05-16 DIAGNOSIS — K219 Gastro-esophageal reflux disease without esophagitis: Secondary | ICD-10-CM | POA: Diagnosis not present

## 2021-05-16 DIAGNOSIS — D649 Anemia, unspecified: Secondary | ICD-10-CM | POA: Diagnosis not present

## 2021-05-16 DIAGNOSIS — I10 Essential (primary) hypertension: Secondary | ICD-10-CM | POA: Diagnosis not present

## 2021-05-18 DIAGNOSIS — K219 Gastro-esophageal reflux disease without esophagitis: Secondary | ICD-10-CM | POA: Diagnosis not present

## 2021-05-18 DIAGNOSIS — I10 Essential (primary) hypertension: Secondary | ICD-10-CM | POA: Diagnosis not present

## 2021-05-18 DIAGNOSIS — M069 Rheumatoid arthritis, unspecified: Secondary | ICD-10-CM | POA: Diagnosis not present

## 2021-05-18 DIAGNOSIS — S81801D Unspecified open wound, right lower leg, subsequent encounter: Secondary | ICD-10-CM | POA: Diagnosis not present

## 2021-05-18 DIAGNOSIS — E785 Hyperlipidemia, unspecified: Secondary | ICD-10-CM | POA: Diagnosis not present

## 2021-05-18 DIAGNOSIS — D649 Anemia, unspecified: Secondary | ICD-10-CM | POA: Diagnosis not present

## 2021-05-19 DIAGNOSIS — Z515 Encounter for palliative care: Secondary | ICD-10-CM | POA: Diagnosis not present

## 2021-05-19 DIAGNOSIS — K5900 Constipation, unspecified: Secondary | ICD-10-CM | POA: Diagnosis not present

## 2021-05-19 DIAGNOSIS — R519 Headache, unspecified: Secondary | ICD-10-CM | POA: Diagnosis not present

## 2021-05-19 DIAGNOSIS — G894 Chronic pain syndrome: Secondary | ICD-10-CM | POA: Diagnosis not present

## 2021-05-21 DIAGNOSIS — Z7952 Long term (current) use of systemic steroids: Secondary | ICD-10-CM | POA: Diagnosis not present

## 2021-05-21 DIAGNOSIS — Z791 Long term (current) use of non-steroidal anti-inflammatories (NSAID): Secondary | ICD-10-CM | POA: Diagnosis not present

## 2021-05-21 DIAGNOSIS — M6281 Muscle weakness (generalized): Secondary | ICD-10-CM | POA: Diagnosis not present

## 2021-05-21 DIAGNOSIS — I10 Essential (primary) hypertension: Secondary | ICD-10-CM | POA: Diagnosis not present

## 2021-05-21 DIAGNOSIS — K219 Gastro-esophageal reflux disease without esophagitis: Secondary | ICD-10-CM | POA: Diagnosis not present

## 2021-05-21 DIAGNOSIS — S81801D Unspecified open wound, right lower leg, subsequent encounter: Secondary | ICD-10-CM | POA: Diagnosis not present

## 2021-05-21 DIAGNOSIS — M1990 Unspecified osteoarthritis, unspecified site: Secondary | ICD-10-CM | POA: Diagnosis not present

## 2021-05-21 DIAGNOSIS — D649 Anemia, unspecified: Secondary | ICD-10-CM | POA: Diagnosis not present

## 2021-05-21 DIAGNOSIS — M5432 Sciatica, left side: Secondary | ICD-10-CM | POA: Diagnosis not present

## 2021-05-21 DIAGNOSIS — M069 Rheumatoid arthritis, unspecified: Secondary | ICD-10-CM | POA: Diagnosis not present

## 2021-05-21 DIAGNOSIS — E785 Hyperlipidemia, unspecified: Secondary | ICD-10-CM | POA: Diagnosis not present

## 2021-05-21 DIAGNOSIS — Z9181 History of falling: Secondary | ICD-10-CM | POA: Diagnosis not present

## 2021-05-21 DIAGNOSIS — Z79891 Long term (current) use of opiate analgesic: Secondary | ICD-10-CM | POA: Diagnosis not present

## 2021-05-23 DIAGNOSIS — K219 Gastro-esophageal reflux disease without esophagitis: Secondary | ICD-10-CM | POA: Diagnosis not present

## 2021-05-23 DIAGNOSIS — S81801D Unspecified open wound, right lower leg, subsequent encounter: Secondary | ICD-10-CM | POA: Diagnosis not present

## 2021-05-23 DIAGNOSIS — M069 Rheumatoid arthritis, unspecified: Secondary | ICD-10-CM | POA: Diagnosis not present

## 2021-05-23 DIAGNOSIS — D649 Anemia, unspecified: Secondary | ICD-10-CM | POA: Diagnosis not present

## 2021-05-23 DIAGNOSIS — I10 Essential (primary) hypertension: Secondary | ICD-10-CM | POA: Diagnosis not present

## 2021-05-23 DIAGNOSIS — E785 Hyperlipidemia, unspecified: Secondary | ICD-10-CM | POA: Diagnosis not present

## 2021-05-25 DIAGNOSIS — M069 Rheumatoid arthritis, unspecified: Secondary | ICD-10-CM | POA: Diagnosis not present

## 2021-05-25 DIAGNOSIS — K219 Gastro-esophageal reflux disease without esophagitis: Secondary | ICD-10-CM | POA: Diagnosis not present

## 2021-05-25 DIAGNOSIS — S81801D Unspecified open wound, right lower leg, subsequent encounter: Secondary | ICD-10-CM | POA: Diagnosis not present

## 2021-05-25 DIAGNOSIS — D649 Anemia, unspecified: Secondary | ICD-10-CM | POA: Diagnosis not present

## 2021-05-25 DIAGNOSIS — E785 Hyperlipidemia, unspecified: Secondary | ICD-10-CM | POA: Diagnosis not present

## 2021-05-25 DIAGNOSIS — I10 Essential (primary) hypertension: Secondary | ICD-10-CM | POA: Diagnosis not present

## 2021-05-30 DIAGNOSIS — K219 Gastro-esophageal reflux disease without esophagitis: Secondary | ICD-10-CM | POA: Diagnosis not present

## 2021-05-30 DIAGNOSIS — D649 Anemia, unspecified: Secondary | ICD-10-CM | POA: Diagnosis not present

## 2021-05-30 DIAGNOSIS — E785 Hyperlipidemia, unspecified: Secondary | ICD-10-CM | POA: Diagnosis not present

## 2021-05-30 DIAGNOSIS — S81801D Unspecified open wound, right lower leg, subsequent encounter: Secondary | ICD-10-CM | POA: Diagnosis not present

## 2021-05-30 DIAGNOSIS — I10 Essential (primary) hypertension: Secondary | ICD-10-CM | POA: Diagnosis not present

## 2021-05-30 DIAGNOSIS — M069 Rheumatoid arthritis, unspecified: Secondary | ICD-10-CM | POA: Diagnosis not present

## 2021-06-01 DIAGNOSIS — E785 Hyperlipidemia, unspecified: Secondary | ICD-10-CM | POA: Diagnosis not present

## 2021-06-01 DIAGNOSIS — M069 Rheumatoid arthritis, unspecified: Secondary | ICD-10-CM | POA: Diagnosis not present

## 2021-06-01 DIAGNOSIS — S81801D Unspecified open wound, right lower leg, subsequent encounter: Secondary | ICD-10-CM | POA: Diagnosis not present

## 2021-06-01 DIAGNOSIS — K219 Gastro-esophageal reflux disease without esophagitis: Secondary | ICD-10-CM | POA: Diagnosis not present

## 2021-06-01 DIAGNOSIS — D649 Anemia, unspecified: Secondary | ICD-10-CM | POA: Diagnosis not present

## 2021-06-01 DIAGNOSIS — I10 Essential (primary) hypertension: Secondary | ICD-10-CM | POA: Diagnosis not present

## 2021-06-02 DIAGNOSIS — R062 Wheezing: Secondary | ICD-10-CM | POA: Diagnosis not present

## 2021-06-02 DIAGNOSIS — K5900 Constipation, unspecified: Secondary | ICD-10-CM | POA: Diagnosis not present

## 2021-06-02 DIAGNOSIS — R52 Pain, unspecified: Secondary | ICD-10-CM | POA: Diagnosis not present

## 2021-06-02 DIAGNOSIS — Z515 Encounter for palliative care: Secondary | ICD-10-CM | POA: Diagnosis not present

## 2021-06-06 DIAGNOSIS — I10 Essential (primary) hypertension: Secondary | ICD-10-CM | POA: Diagnosis not present

## 2021-06-06 DIAGNOSIS — S81801D Unspecified open wound, right lower leg, subsequent encounter: Secondary | ICD-10-CM | POA: Diagnosis not present

## 2021-06-06 DIAGNOSIS — K219 Gastro-esophageal reflux disease without esophagitis: Secondary | ICD-10-CM | POA: Diagnosis not present

## 2021-06-06 DIAGNOSIS — E785 Hyperlipidemia, unspecified: Secondary | ICD-10-CM | POA: Diagnosis not present

## 2021-06-06 DIAGNOSIS — D649 Anemia, unspecified: Secondary | ICD-10-CM | POA: Diagnosis not present

## 2021-06-06 DIAGNOSIS — M069 Rheumatoid arthritis, unspecified: Secondary | ICD-10-CM | POA: Diagnosis not present

## 2021-06-08 DIAGNOSIS — D649 Anemia, unspecified: Secondary | ICD-10-CM | POA: Diagnosis not present

## 2021-06-08 DIAGNOSIS — K219 Gastro-esophageal reflux disease without esophagitis: Secondary | ICD-10-CM | POA: Diagnosis not present

## 2021-06-08 DIAGNOSIS — M069 Rheumatoid arthritis, unspecified: Secondary | ICD-10-CM | POA: Diagnosis not present

## 2021-06-08 DIAGNOSIS — S81801D Unspecified open wound, right lower leg, subsequent encounter: Secondary | ICD-10-CM | POA: Diagnosis not present

## 2021-06-08 DIAGNOSIS — E785 Hyperlipidemia, unspecified: Secondary | ICD-10-CM | POA: Diagnosis not present

## 2021-06-08 DIAGNOSIS — I10 Essential (primary) hypertension: Secondary | ICD-10-CM | POA: Diagnosis not present

## 2021-06-09 DIAGNOSIS — L089 Local infection of the skin and subcutaneous tissue, unspecified: Secondary | ICD-10-CM | POA: Diagnosis not present

## 2021-06-09 DIAGNOSIS — I872 Venous insufficiency (chronic) (peripheral): Secondary | ICD-10-CM | POA: Diagnosis not present

## 2021-06-09 DIAGNOSIS — R0609 Other forms of dyspnea: Secondary | ICD-10-CM | POA: Diagnosis not present

## 2021-06-13 DIAGNOSIS — I10 Essential (primary) hypertension: Secondary | ICD-10-CM | POA: Diagnosis not present

## 2021-06-13 DIAGNOSIS — E785 Hyperlipidemia, unspecified: Secondary | ICD-10-CM | POA: Diagnosis not present

## 2021-06-13 DIAGNOSIS — D649 Anemia, unspecified: Secondary | ICD-10-CM | POA: Diagnosis not present

## 2021-06-13 DIAGNOSIS — S81801D Unspecified open wound, right lower leg, subsequent encounter: Secondary | ICD-10-CM | POA: Diagnosis not present

## 2021-06-13 DIAGNOSIS — K219 Gastro-esophageal reflux disease without esophagitis: Secondary | ICD-10-CM | POA: Diagnosis not present

## 2021-06-13 DIAGNOSIS — M069 Rheumatoid arthritis, unspecified: Secondary | ICD-10-CM | POA: Diagnosis not present

## 2021-06-14 DIAGNOSIS — R2689 Other abnormalities of gait and mobility: Secondary | ICD-10-CM | POA: Diagnosis not present

## 2021-06-14 DIAGNOSIS — R062 Wheezing: Secondary | ICD-10-CM | POA: Diagnosis not present

## 2021-06-14 DIAGNOSIS — R627 Adult failure to thrive: Secondary | ICD-10-CM | POA: Diagnosis not present

## 2021-06-14 DIAGNOSIS — K5900 Constipation, unspecified: Secondary | ICD-10-CM | POA: Diagnosis not present

## 2021-06-14 DIAGNOSIS — R52 Pain, unspecified: Secondary | ICD-10-CM | POA: Diagnosis not present

## 2021-06-14 DIAGNOSIS — J309 Allergic rhinitis, unspecified: Secondary | ICD-10-CM | POA: Diagnosis not present

## 2021-06-14 DIAGNOSIS — R051 Acute cough: Secondary | ICD-10-CM | POA: Diagnosis not present

## 2021-06-15 DIAGNOSIS — G8929 Other chronic pain: Secondary | ICD-10-CM | POA: Diagnosis not present

## 2021-06-15 DIAGNOSIS — S81801D Unspecified open wound, right lower leg, subsequent encounter: Secondary | ICD-10-CM | POA: Diagnosis not present

## 2021-06-15 DIAGNOSIS — D649 Anemia, unspecified: Secondary | ICD-10-CM | POA: Diagnosis not present

## 2021-06-15 DIAGNOSIS — E785 Hyperlipidemia, unspecified: Secondary | ICD-10-CM | POA: Diagnosis not present

## 2021-06-15 DIAGNOSIS — K219 Gastro-esophageal reflux disease without esophagitis: Secondary | ICD-10-CM | POA: Diagnosis not present

## 2021-06-15 DIAGNOSIS — M069 Rheumatoid arthritis, unspecified: Secondary | ICD-10-CM | POA: Diagnosis not present

## 2021-06-15 DIAGNOSIS — I10 Essential (primary) hypertension: Secondary | ICD-10-CM | POA: Diagnosis not present

## 2021-06-15 DIAGNOSIS — R1110 Vomiting, unspecified: Secondary | ICD-10-CM | POA: Diagnosis not present

## 2021-06-17 DIAGNOSIS — S81801D Unspecified open wound, right lower leg, subsequent encounter: Secondary | ICD-10-CM | POA: Diagnosis not present

## 2021-06-17 DIAGNOSIS — K219 Gastro-esophageal reflux disease without esophagitis: Secondary | ICD-10-CM | POA: Diagnosis not present

## 2021-06-17 DIAGNOSIS — I10 Essential (primary) hypertension: Secondary | ICD-10-CM | POA: Diagnosis not present

## 2021-06-17 DIAGNOSIS — E785 Hyperlipidemia, unspecified: Secondary | ICD-10-CM | POA: Diagnosis not present

## 2021-06-17 DIAGNOSIS — D649 Anemia, unspecified: Secondary | ICD-10-CM | POA: Diagnosis not present

## 2021-06-17 DIAGNOSIS — M069 Rheumatoid arthritis, unspecified: Secondary | ICD-10-CM | POA: Diagnosis not present

## 2021-06-20 DIAGNOSIS — M545 Low back pain, unspecified: Secondary | ICD-10-CM | POA: Diagnosis not present

## 2021-06-20 DIAGNOSIS — Z791 Long term (current) use of non-steroidal anti-inflammatories (NSAID): Secondary | ICD-10-CM | POA: Diagnosis not present

## 2021-06-20 DIAGNOSIS — U071 COVID-19: Secondary | ICD-10-CM | POA: Diagnosis not present

## 2021-06-20 DIAGNOSIS — M069 Rheumatoid arthritis, unspecified: Secondary | ICD-10-CM | POA: Diagnosis not present

## 2021-06-20 DIAGNOSIS — M5432 Sciatica, left side: Secondary | ICD-10-CM | POA: Diagnosis not present

## 2021-06-20 DIAGNOSIS — Z7952 Long term (current) use of systemic steroids: Secondary | ICD-10-CM | POA: Diagnosis not present

## 2021-06-20 DIAGNOSIS — L97921 Non-pressure chronic ulcer of unspecified part of left lower leg limited to breakdown of skin: Secondary | ICD-10-CM | POA: Diagnosis not present

## 2021-06-20 DIAGNOSIS — D649 Anemia, unspecified: Secondary | ICD-10-CM | POA: Diagnosis not present

## 2021-06-20 DIAGNOSIS — M1990 Unspecified osteoarthritis, unspecified site: Secondary | ICD-10-CM | POA: Diagnosis not present

## 2021-06-20 DIAGNOSIS — I10 Essential (primary) hypertension: Secondary | ICD-10-CM | POA: Diagnosis not present

## 2021-06-20 DIAGNOSIS — Z9181 History of falling: Secondary | ICD-10-CM | POA: Diagnosis not present

## 2021-06-20 DIAGNOSIS — E785 Hyperlipidemia, unspecified: Secondary | ICD-10-CM | POA: Diagnosis not present

## 2021-06-20 DIAGNOSIS — J310 Chronic rhinitis: Secondary | ICD-10-CM | POA: Diagnosis not present

## 2021-06-20 DIAGNOSIS — G894 Chronic pain syndrome: Secondary | ICD-10-CM | POA: Diagnosis not present

## 2021-06-20 DIAGNOSIS — S81801D Unspecified open wound, right lower leg, subsequent encounter: Secondary | ICD-10-CM | POA: Diagnosis not present

## 2021-06-20 DIAGNOSIS — Z79891 Long term (current) use of opiate analgesic: Secondary | ICD-10-CM | POA: Diagnosis not present

## 2021-06-20 DIAGNOSIS — R5383 Other fatigue: Secondary | ICD-10-CM | POA: Diagnosis not present

## 2021-06-20 DIAGNOSIS — M6281 Muscle weakness (generalized): Secondary | ICD-10-CM | POA: Diagnosis not present

## 2021-06-20 DIAGNOSIS — K219 Gastro-esophageal reflux disease without esophagitis: Secondary | ICD-10-CM | POA: Diagnosis not present

## 2021-06-23 DIAGNOSIS — D649 Anemia, unspecified: Secondary | ICD-10-CM | POA: Diagnosis not present

## 2021-06-23 DIAGNOSIS — I10 Essential (primary) hypertension: Secondary | ICD-10-CM | POA: Diagnosis not present

## 2021-06-23 DIAGNOSIS — K219 Gastro-esophageal reflux disease without esophagitis: Secondary | ICD-10-CM | POA: Diagnosis not present

## 2021-06-23 DIAGNOSIS — S81801D Unspecified open wound, right lower leg, subsequent encounter: Secondary | ICD-10-CM | POA: Diagnosis not present

## 2021-06-23 DIAGNOSIS — E785 Hyperlipidemia, unspecified: Secondary | ICD-10-CM | POA: Diagnosis not present

## 2021-06-23 DIAGNOSIS — M069 Rheumatoid arthritis, unspecified: Secondary | ICD-10-CM | POA: Diagnosis not present

## 2021-06-27 DIAGNOSIS — G894 Chronic pain syndrome: Secondary | ICD-10-CM | POA: Diagnosis not present

## 2021-06-27 DIAGNOSIS — E785 Hyperlipidemia, unspecified: Secondary | ICD-10-CM | POA: Diagnosis not present

## 2021-06-27 DIAGNOSIS — D649 Anemia, unspecified: Secondary | ICD-10-CM | POA: Diagnosis not present

## 2021-06-27 DIAGNOSIS — S81801D Unspecified open wound, right lower leg, subsequent encounter: Secondary | ICD-10-CM | POA: Diagnosis not present

## 2021-06-27 DIAGNOSIS — U071 COVID-19: Secondary | ICD-10-CM | POA: Diagnosis not present

## 2021-06-27 DIAGNOSIS — K5903 Drug induced constipation: Secondary | ICD-10-CM | POA: Diagnosis not present

## 2021-06-27 DIAGNOSIS — L03115 Cellulitis of right lower limb: Secondary | ICD-10-CM | POA: Diagnosis not present

## 2021-06-27 DIAGNOSIS — M069 Rheumatoid arthritis, unspecified: Secondary | ICD-10-CM | POA: Diagnosis not present

## 2021-06-27 DIAGNOSIS — K219 Gastro-esophageal reflux disease without esophagitis: Secondary | ICD-10-CM | POA: Diagnosis not present

## 2021-06-27 DIAGNOSIS — I10 Essential (primary) hypertension: Secondary | ICD-10-CM | POA: Diagnosis not present

## 2021-06-27 DIAGNOSIS — R5381 Other malaise: Secondary | ICD-10-CM | POA: Diagnosis not present

## 2021-06-29 DIAGNOSIS — K5903 Drug induced constipation: Secondary | ICD-10-CM | POA: Diagnosis not present

## 2021-06-29 DIAGNOSIS — E860 Dehydration: Secondary | ICD-10-CM | POA: Diagnosis not present

## 2021-06-29 DIAGNOSIS — G894 Chronic pain syndrome: Secondary | ICD-10-CM | POA: Diagnosis not present

## 2021-07-01 DIAGNOSIS — K219 Gastro-esophageal reflux disease without esophagitis: Secondary | ICD-10-CM | POA: Diagnosis not present

## 2021-07-01 DIAGNOSIS — S81801D Unspecified open wound, right lower leg, subsequent encounter: Secondary | ICD-10-CM | POA: Diagnosis not present

## 2021-07-01 DIAGNOSIS — R109 Unspecified abdominal pain: Secondary | ICD-10-CM | POA: Diagnosis not present

## 2021-07-01 DIAGNOSIS — E785 Hyperlipidemia, unspecified: Secondary | ICD-10-CM | POA: Diagnosis not present

## 2021-07-01 DIAGNOSIS — D649 Anemia, unspecified: Secondary | ICD-10-CM | POA: Diagnosis not present

## 2021-07-01 DIAGNOSIS — I10 Essential (primary) hypertension: Secondary | ICD-10-CM | POA: Diagnosis not present

## 2021-07-01 DIAGNOSIS — M069 Rheumatoid arthritis, unspecified: Secondary | ICD-10-CM | POA: Diagnosis not present

## 2021-07-04 DIAGNOSIS — K219 Gastro-esophageal reflux disease without esophagitis: Secondary | ICD-10-CM | POA: Diagnosis not present

## 2021-07-04 DIAGNOSIS — D649 Anemia, unspecified: Secondary | ICD-10-CM | POA: Diagnosis not present

## 2021-07-04 DIAGNOSIS — E785 Hyperlipidemia, unspecified: Secondary | ICD-10-CM | POA: Diagnosis not present

## 2021-07-04 DIAGNOSIS — I10 Essential (primary) hypertension: Secondary | ICD-10-CM | POA: Diagnosis not present

## 2021-07-04 DIAGNOSIS — S81801D Unspecified open wound, right lower leg, subsequent encounter: Secondary | ICD-10-CM | POA: Diagnosis not present

## 2021-07-04 DIAGNOSIS — M069 Rheumatoid arthritis, unspecified: Secondary | ICD-10-CM | POA: Diagnosis not present

## 2021-07-05 DIAGNOSIS — G894 Chronic pain syndrome: Secondary | ICD-10-CM | POA: Diagnosis not present

## 2021-07-05 DIAGNOSIS — B3789 Other sites of candidiasis: Secondary | ICD-10-CM | POA: Diagnosis not present

## 2021-07-05 DIAGNOSIS — K5903 Drug induced constipation: Secondary | ICD-10-CM | POA: Diagnosis not present

## 2021-07-08 DIAGNOSIS — I10 Essential (primary) hypertension: Secondary | ICD-10-CM | POA: Diagnosis not present

## 2021-07-08 DIAGNOSIS — D649 Anemia, unspecified: Secondary | ICD-10-CM | POA: Diagnosis not present

## 2021-07-08 DIAGNOSIS — K219 Gastro-esophageal reflux disease without esophagitis: Secondary | ICD-10-CM | POA: Diagnosis not present

## 2021-07-08 DIAGNOSIS — S81801D Unspecified open wound, right lower leg, subsequent encounter: Secondary | ICD-10-CM | POA: Diagnosis not present

## 2021-07-08 DIAGNOSIS — M069 Rheumatoid arthritis, unspecified: Secondary | ICD-10-CM | POA: Diagnosis not present

## 2021-07-08 DIAGNOSIS — E785 Hyperlipidemia, unspecified: Secondary | ICD-10-CM | POA: Diagnosis not present

## 2021-07-11 DIAGNOSIS — D649 Anemia, unspecified: Secondary | ICD-10-CM | POA: Diagnosis not present

## 2021-07-11 DIAGNOSIS — I10 Essential (primary) hypertension: Secondary | ICD-10-CM | POA: Diagnosis not present

## 2021-07-11 DIAGNOSIS — K219 Gastro-esophageal reflux disease without esophagitis: Secondary | ICD-10-CM | POA: Diagnosis not present

## 2021-07-11 DIAGNOSIS — E785 Hyperlipidemia, unspecified: Secondary | ICD-10-CM | POA: Diagnosis not present

## 2021-07-11 DIAGNOSIS — M069 Rheumatoid arthritis, unspecified: Secondary | ICD-10-CM | POA: Diagnosis not present

## 2021-07-11 DIAGNOSIS — S81801D Unspecified open wound, right lower leg, subsequent encounter: Secondary | ICD-10-CM | POA: Diagnosis not present

## 2021-07-15 DIAGNOSIS — E785 Hyperlipidemia, unspecified: Secondary | ICD-10-CM | POA: Diagnosis not present

## 2021-07-15 DIAGNOSIS — S81801D Unspecified open wound, right lower leg, subsequent encounter: Secondary | ICD-10-CM | POA: Diagnosis not present

## 2021-07-15 DIAGNOSIS — D649 Anemia, unspecified: Secondary | ICD-10-CM | POA: Diagnosis not present

## 2021-07-15 DIAGNOSIS — K219 Gastro-esophageal reflux disease without esophagitis: Secondary | ICD-10-CM | POA: Diagnosis not present

## 2021-07-15 DIAGNOSIS — M069 Rheumatoid arthritis, unspecified: Secondary | ICD-10-CM | POA: Diagnosis not present

## 2021-07-15 DIAGNOSIS — I10 Essential (primary) hypertension: Secondary | ICD-10-CM | POA: Diagnosis not present

## 2021-07-18 DIAGNOSIS — E785 Hyperlipidemia, unspecified: Secondary | ICD-10-CM | POA: Diagnosis not present

## 2021-07-18 DIAGNOSIS — K219 Gastro-esophageal reflux disease without esophagitis: Secondary | ICD-10-CM | POA: Diagnosis not present

## 2021-07-18 DIAGNOSIS — D649 Anemia, unspecified: Secondary | ICD-10-CM | POA: Diagnosis not present

## 2021-07-18 DIAGNOSIS — M069 Rheumatoid arthritis, unspecified: Secondary | ICD-10-CM | POA: Diagnosis not present

## 2021-07-18 DIAGNOSIS — I10 Essential (primary) hypertension: Secondary | ICD-10-CM | POA: Diagnosis not present

## 2021-07-18 DIAGNOSIS — S81801D Unspecified open wound, right lower leg, subsequent encounter: Secondary | ICD-10-CM | POA: Diagnosis not present

## 2021-07-20 DIAGNOSIS — Z9181 History of falling: Secondary | ICD-10-CM | POA: Diagnosis not present

## 2021-07-20 DIAGNOSIS — E785 Hyperlipidemia, unspecified: Secondary | ICD-10-CM | POA: Diagnosis not present

## 2021-07-20 DIAGNOSIS — M1990 Unspecified osteoarthritis, unspecified site: Secondary | ICD-10-CM | POA: Diagnosis not present

## 2021-07-20 DIAGNOSIS — Z79891 Long term (current) use of opiate analgesic: Secondary | ICD-10-CM | POA: Diagnosis not present

## 2021-07-20 DIAGNOSIS — K219 Gastro-esophageal reflux disease without esophagitis: Secondary | ICD-10-CM | POA: Diagnosis not present

## 2021-07-20 DIAGNOSIS — S81801D Unspecified open wound, right lower leg, subsequent encounter: Secondary | ICD-10-CM | POA: Diagnosis not present

## 2021-07-20 DIAGNOSIS — D649 Anemia, unspecified: Secondary | ICD-10-CM | POA: Diagnosis not present

## 2021-07-20 DIAGNOSIS — M069 Rheumatoid arthritis, unspecified: Secondary | ICD-10-CM | POA: Diagnosis not present

## 2021-07-20 DIAGNOSIS — I10 Essential (primary) hypertension: Secondary | ICD-10-CM | POA: Diagnosis not present

## 2021-07-20 DIAGNOSIS — M5432 Sciatica, left side: Secondary | ICD-10-CM | POA: Diagnosis not present

## 2021-07-20 DIAGNOSIS — Z7952 Long term (current) use of systemic steroids: Secondary | ICD-10-CM | POA: Diagnosis not present

## 2021-07-21 DIAGNOSIS — S81801D Unspecified open wound, right lower leg, subsequent encounter: Secondary | ICD-10-CM | POA: Diagnosis not present

## 2021-07-21 DIAGNOSIS — D649 Anemia, unspecified: Secondary | ICD-10-CM | POA: Diagnosis not present

## 2021-07-21 DIAGNOSIS — E785 Hyperlipidemia, unspecified: Secondary | ICD-10-CM | POA: Diagnosis not present

## 2021-07-21 DIAGNOSIS — M069 Rheumatoid arthritis, unspecified: Secondary | ICD-10-CM | POA: Diagnosis not present

## 2021-07-21 DIAGNOSIS — I10 Essential (primary) hypertension: Secondary | ICD-10-CM | POA: Diagnosis not present

## 2021-07-21 DIAGNOSIS — K219 Gastro-esophageal reflux disease without esophagitis: Secondary | ICD-10-CM | POA: Diagnosis not present

## 2021-07-25 DIAGNOSIS — E785 Hyperlipidemia, unspecified: Secondary | ICD-10-CM | POA: Diagnosis not present

## 2021-07-25 DIAGNOSIS — D649 Anemia, unspecified: Secondary | ICD-10-CM | POA: Diagnosis not present

## 2021-07-25 DIAGNOSIS — S81801D Unspecified open wound, right lower leg, subsequent encounter: Secondary | ICD-10-CM | POA: Diagnosis not present

## 2021-07-25 DIAGNOSIS — K219 Gastro-esophageal reflux disease without esophagitis: Secondary | ICD-10-CM | POA: Diagnosis not present

## 2021-07-25 DIAGNOSIS — M069 Rheumatoid arthritis, unspecified: Secondary | ICD-10-CM | POA: Diagnosis not present

## 2021-07-25 DIAGNOSIS — I10 Essential (primary) hypertension: Secondary | ICD-10-CM | POA: Diagnosis not present

## 2021-07-28 DIAGNOSIS — M069 Rheumatoid arthritis, unspecified: Secondary | ICD-10-CM | POA: Diagnosis not present

## 2021-07-28 DIAGNOSIS — E785 Hyperlipidemia, unspecified: Secondary | ICD-10-CM | POA: Diagnosis not present

## 2021-07-28 DIAGNOSIS — D649 Anemia, unspecified: Secondary | ICD-10-CM | POA: Diagnosis not present

## 2021-07-28 DIAGNOSIS — I10 Essential (primary) hypertension: Secondary | ICD-10-CM | POA: Diagnosis not present

## 2021-07-28 DIAGNOSIS — S81801D Unspecified open wound, right lower leg, subsequent encounter: Secondary | ICD-10-CM | POA: Diagnosis not present

## 2021-07-28 DIAGNOSIS — K219 Gastro-esophageal reflux disease without esophagitis: Secondary | ICD-10-CM | POA: Diagnosis not present

## 2021-08-01 DIAGNOSIS — M069 Rheumatoid arthritis, unspecified: Secondary | ICD-10-CM | POA: Diagnosis not present

## 2021-08-01 DIAGNOSIS — S81801D Unspecified open wound, right lower leg, subsequent encounter: Secondary | ICD-10-CM | POA: Diagnosis not present

## 2021-08-01 DIAGNOSIS — K219 Gastro-esophageal reflux disease without esophagitis: Secondary | ICD-10-CM | POA: Diagnosis not present

## 2021-08-01 DIAGNOSIS — D649 Anemia, unspecified: Secondary | ICD-10-CM | POA: Diagnosis not present

## 2021-08-01 DIAGNOSIS — E785 Hyperlipidemia, unspecified: Secondary | ICD-10-CM | POA: Diagnosis not present

## 2021-08-01 DIAGNOSIS — I10 Essential (primary) hypertension: Secondary | ICD-10-CM | POA: Diagnosis not present

## 2021-08-04 DIAGNOSIS — S81801D Unspecified open wound, right lower leg, subsequent encounter: Secondary | ICD-10-CM | POA: Diagnosis not present

## 2021-08-04 DIAGNOSIS — M069 Rheumatoid arthritis, unspecified: Secondary | ICD-10-CM | POA: Diagnosis not present

## 2021-08-04 DIAGNOSIS — E785 Hyperlipidemia, unspecified: Secondary | ICD-10-CM | POA: Diagnosis not present

## 2021-08-04 DIAGNOSIS — D649 Anemia, unspecified: Secondary | ICD-10-CM | POA: Diagnosis not present

## 2021-08-04 DIAGNOSIS — K219 Gastro-esophageal reflux disease without esophagitis: Secondary | ICD-10-CM | POA: Diagnosis not present

## 2021-08-04 DIAGNOSIS — I10 Essential (primary) hypertension: Secondary | ICD-10-CM | POA: Diagnosis not present

## 2021-08-08 DIAGNOSIS — K219 Gastro-esophageal reflux disease without esophagitis: Secondary | ICD-10-CM | POA: Diagnosis not present

## 2021-08-08 DIAGNOSIS — S81801D Unspecified open wound, right lower leg, subsequent encounter: Secondary | ICD-10-CM | POA: Diagnosis not present

## 2021-08-08 DIAGNOSIS — D649 Anemia, unspecified: Secondary | ICD-10-CM | POA: Diagnosis not present

## 2021-08-08 DIAGNOSIS — E785 Hyperlipidemia, unspecified: Secondary | ICD-10-CM | POA: Diagnosis not present

## 2021-08-08 DIAGNOSIS — I10 Essential (primary) hypertension: Secondary | ICD-10-CM | POA: Diagnosis not present

## 2021-08-08 DIAGNOSIS — M069 Rheumatoid arthritis, unspecified: Secondary | ICD-10-CM | POA: Diagnosis not present

## 2021-08-11 DIAGNOSIS — K5909 Other constipation: Secondary | ICD-10-CM | POA: Diagnosis not present

## 2021-08-11 DIAGNOSIS — K649 Unspecified hemorrhoids: Secondary | ICD-10-CM | POA: Diagnosis not present

## 2021-08-11 DIAGNOSIS — Z79899 Other long term (current) drug therapy: Secondary | ICD-10-CM | POA: Diagnosis not present

## 2021-08-16 DIAGNOSIS — D649 Anemia, unspecified: Secondary | ICD-10-CM | POA: Diagnosis not present

## 2021-08-16 DIAGNOSIS — S81801D Unspecified open wound, right lower leg, subsequent encounter: Secondary | ICD-10-CM | POA: Diagnosis not present

## 2021-08-16 DIAGNOSIS — E785 Hyperlipidemia, unspecified: Secondary | ICD-10-CM | POA: Diagnosis not present

## 2021-08-16 DIAGNOSIS — K219 Gastro-esophageal reflux disease without esophagitis: Secondary | ICD-10-CM | POA: Diagnosis not present

## 2021-08-16 DIAGNOSIS — M069 Rheumatoid arthritis, unspecified: Secondary | ICD-10-CM | POA: Diagnosis not present

## 2021-08-16 DIAGNOSIS — I10 Essential (primary) hypertension: Secondary | ICD-10-CM | POA: Diagnosis not present

## 2021-08-18 DIAGNOSIS — R194 Change in bowel habit: Secondary | ICD-10-CM | POA: Diagnosis not present

## 2021-08-18 DIAGNOSIS — R195 Other fecal abnormalities: Secondary | ICD-10-CM | POA: Diagnosis not present

## 2021-08-19 DIAGNOSIS — M1990 Unspecified osteoarthritis, unspecified site: Secondary | ICD-10-CM | POA: Diagnosis not present

## 2021-08-19 DIAGNOSIS — M069 Rheumatoid arthritis, unspecified: Secondary | ICD-10-CM | POA: Diagnosis not present

## 2021-08-19 DIAGNOSIS — E785 Hyperlipidemia, unspecified: Secondary | ICD-10-CM | POA: Diagnosis not present

## 2021-08-19 DIAGNOSIS — Z7952 Long term (current) use of systemic steroids: Secondary | ICD-10-CM | POA: Diagnosis not present

## 2021-08-19 DIAGNOSIS — M5432 Sciatica, left side: Secondary | ICD-10-CM | POA: Diagnosis not present

## 2021-08-19 DIAGNOSIS — S81801D Unspecified open wound, right lower leg, subsequent encounter: Secondary | ICD-10-CM | POA: Diagnosis not present

## 2021-08-19 DIAGNOSIS — I10 Essential (primary) hypertension: Secondary | ICD-10-CM | POA: Diagnosis not present

## 2021-08-19 DIAGNOSIS — D649 Anemia, unspecified: Secondary | ICD-10-CM | POA: Diagnosis not present

## 2021-08-19 DIAGNOSIS — Z79891 Long term (current) use of opiate analgesic: Secondary | ICD-10-CM | POA: Diagnosis not present

## 2021-08-19 DIAGNOSIS — Z9181 History of falling: Secondary | ICD-10-CM | POA: Diagnosis not present

## 2021-08-19 DIAGNOSIS — K219 Gastro-esophageal reflux disease without esophagitis: Secondary | ICD-10-CM | POA: Diagnosis not present

## 2021-08-22 DIAGNOSIS — K219 Gastro-esophageal reflux disease without esophagitis: Secondary | ICD-10-CM | POA: Diagnosis not present

## 2021-08-22 DIAGNOSIS — M069 Rheumatoid arthritis, unspecified: Secondary | ICD-10-CM | POA: Diagnosis not present

## 2021-08-22 DIAGNOSIS — I10 Essential (primary) hypertension: Secondary | ICD-10-CM | POA: Diagnosis not present

## 2021-08-22 DIAGNOSIS — D649 Anemia, unspecified: Secondary | ICD-10-CM | POA: Diagnosis not present

## 2021-08-22 DIAGNOSIS — S81801D Unspecified open wound, right lower leg, subsequent encounter: Secondary | ICD-10-CM | POA: Diagnosis not present

## 2021-08-22 DIAGNOSIS — E785 Hyperlipidemia, unspecified: Secondary | ICD-10-CM | POA: Diagnosis not present

## 2021-08-29 DIAGNOSIS — M069 Rheumatoid arthritis, unspecified: Secondary | ICD-10-CM | POA: Diagnosis not present

## 2021-08-29 DIAGNOSIS — D649 Anemia, unspecified: Secondary | ICD-10-CM | POA: Diagnosis not present

## 2021-08-29 DIAGNOSIS — S81801D Unspecified open wound, right lower leg, subsequent encounter: Secondary | ICD-10-CM | POA: Diagnosis not present

## 2021-08-29 DIAGNOSIS — I10 Essential (primary) hypertension: Secondary | ICD-10-CM | POA: Diagnosis not present

## 2021-08-29 DIAGNOSIS — K219 Gastro-esophageal reflux disease without esophagitis: Secondary | ICD-10-CM | POA: Diagnosis not present

## 2021-08-29 DIAGNOSIS — E785 Hyperlipidemia, unspecified: Secondary | ICD-10-CM | POA: Diagnosis not present

## 2021-08-31 DIAGNOSIS — Z79899 Other long term (current) drug therapy: Secondary | ICD-10-CM | POA: Diagnosis not present

## 2021-08-31 DIAGNOSIS — K649 Unspecified hemorrhoids: Secondary | ICD-10-CM | POA: Diagnosis not present

## 2021-10-20 DIAGNOSIS — H109 Unspecified conjunctivitis: Secondary | ICD-10-CM | POA: Diagnosis not present

## 2021-12-22 DIAGNOSIS — J069 Acute upper respiratory infection, unspecified: Secondary | ICD-10-CM | POA: Diagnosis not present

## 2022-01-17 DIAGNOSIS — S00212A Abrasion of left eyelid and periocular area, initial encounter: Secondary | ICD-10-CM | POA: Diagnosis not present

## 2022-01-17 DIAGNOSIS — S00211A Abrasion of right eyelid and periocular area, initial encounter: Secondary | ICD-10-CM | POA: Diagnosis not present

## 2022-01-24 DIAGNOSIS — R630 Anorexia: Secondary | ICD-10-CM | POA: Diagnosis not present

## 2022-01-24 DIAGNOSIS — R300 Dysuria: Secondary | ICD-10-CM | POA: Diagnosis not present

## 2022-01-24 DIAGNOSIS — R531 Weakness: Secondary | ICD-10-CM | POA: Diagnosis not present

## 2022-01-31 DIAGNOSIS — R630 Anorexia: Secondary | ICD-10-CM | POA: Diagnosis not present

## 2022-01-31 DIAGNOSIS — R531 Weakness: Secondary | ICD-10-CM | POA: Diagnosis not present

## 2022-03-07 ENCOUNTER — Ambulatory Visit (INDEPENDENT_AMBULATORY_CARE_PROVIDER_SITE_OTHER): Payer: Medicare Other | Admitting: Internal Medicine

## 2022-03-07 VITALS — BP 110/64 | HR 80 | Temp 98.6°F | Resp 16 | Ht <= 58 in

## 2022-03-07 DIAGNOSIS — M7989 Other specified soft tissue disorders: Secondary | ICD-10-CM | POA: Diagnosis not present

## 2022-03-07 DIAGNOSIS — M79674 Pain in right toe(s): Secondary | ICD-10-CM | POA: Diagnosis not present

## 2022-03-07 NOTE — Assessment & Plan Note (Signed)
Doubt that it is a gout as movement of joint is not painful. I will start cephalexin 500 mg three time a day for 7 days but will do xray of right foot.

## 2022-03-07 NOTE — Progress Notes (Signed)
   Established Patient Office Visit  Subjective   Patient ID: [name], female    DOB: 07/16/1917  Age: 88 y.o. MRN: [id number]  Chief Complaint  Patient presents with   Left foot pain    Started today, redness and swelling and can't bear weight    HPI 88 years old female woke up with severe pain in her right big toe and was unable to put weight on her right foot. She denies any injury or fall. She has no history of gouty arthritis. She is here for evaluation.    Review of Systems  Constitutional: Negative.   Musculoskeletal:  Positive for joint pain.  Neurological: Negative.       Objective:     BP 110/64 (BP Location: Left Arm, Patient Position: Sitting, Cuff Size: Normal)   Pulse 80   Temp 98.6 F (37 C) (Temporal)   Resp 16   Ht '4\' 9"'$  (1.448 m)   SpO2 91%   BMI 27.24 kg/m    Physical Exam HENT:     Head: Normocephalic and atraumatic.  Cardiovascular:     Rate and Rhythm: Normal rate and regular rhythm.     Heart sounds: Normal heart sounds.  Musculoskeletal:        General: Swelling and tenderness present.  Neurological:     General: No focal deficit present.     Mental Status: She is oriented to person, place, and time.   She has redness, swelling and tenderness first metatarsophalyngeal joint. Mild pain on movement of this joint.    No results found for any visits on 03/07/22.   The ASCVD Risk score (Arnett DK, et al., 2019) failed to calculate for the following reasons:   The 2019 ASCVD risk score is only valid for ages 34 to 19    Assessment & Plan:   Problem List Items Addressed This Visit       Other   Pain and swelling of toe of right foot - Primary    No follow-ups on file.    Garwin Brothers, MD

## 2022-03-09 DIAGNOSIS — M79671 Pain in right foot: Secondary | ICD-10-CM | POA: Diagnosis not present

## 2022-03-21 ENCOUNTER — Ambulatory Visit: Payer: Medicare Other | Admitting: Internal Medicine

## 2022-03-23 ENCOUNTER — Encounter: Payer: Self-pay | Admitting: Internal Medicine

## 2022-03-23 ENCOUNTER — Ambulatory Visit: Payer: Medicare Other | Admitting: Internal Medicine

## 2022-03-23 VITALS — BP 116/70 | HR 74 | Temp 98.0°F | Resp 18 | Ht <= 58 in | Wt 128.5 lb

## 2022-03-23 DIAGNOSIS — I872 Venous insufficiency (chronic) (peripheral): Secondary | ICD-10-CM

## 2022-03-23 NOTE — Assessment & Plan Note (Signed)
She has no pulmonary edema or other problems and she has mild edema in her feet and ankles.  I think she has chronic venous insufficieny.  I want her to elevated her feet, cut sodium and use ace wraps to her knees.

## 2022-03-23 NOTE — Progress Notes (Signed)
Office Visit  Subjective   Patient ID: [name]   DOB: April 07, 1918   Age: 88 y.o.   MRN: [id number]   Chief Complaint Chief Complaint  Patient presents with   Foot Swelling    Bilateral     History of Present Illness The patient is a 88 years old female who presents to clinic today with 2 week history of swelling in her feet and ankles bilaterally.  There is no pain.  She did see Dr. Reesa Chew over 2 weeks ago where he noted severe pain in her right big toe and was unable to put weight on her right foot. She denied any injury or fall at that time or since she was last seen.  She was placed on antibiotics which did not helping.  She states that she has had swelling of her feet and ankles in the past.  She used to wear compression hose.          Past Medical History Past Medical History:  Diagnosis Date   Hypertension      Allergies Allergies  Allergen Reactions   Clinoril [Sulindac] Other (See Comments)    unknown   Doxycycline Other (See Comments)    unknown   Ketoconazole Other (See Comments)    unknown   Naprosyn [Naproxen] Other (See Comments)    unknown   Neosporin [Neomycin-Bacitracin Zn-Polymyx] Other (See Comments)    unknown   Voltaren [Diclofenac Sodium] Other (See Comments)    unknown   Amoxicillin Rash   Penicillins Rash    Did it involve swelling of the face/tongue/throat, SOB, or low BP? No Did it involve sudden or severe rash/hives, skin peeling, or any reaction on the inside of your mouth or nose? Yes Did you need to seek medical attention at a hospital or doctor's office? No When did it last happen?    childhood   If all above answers are "NO", may proceed with cephalosporin use.      Review of Systems Review of Systems  Constitutional:  Negative for chills and fever.  Respiratory:  Negative for cough and shortness of breath.   Cardiovascular:  Negative for chest pain, palpitations and leg swelling.  Gastrointestinal:  Negative for  constipation, diarrhea, nausea and vomiting.  Musculoskeletal:  Negative for myalgias.  Neurological:  Negative for dizziness, weakness and headaches.       Objective:    Vitals BP 116/70 (BP Location: Left Arm, Patient Position: Sitting, Cuff Size: Normal)   Pulse 74   Temp 98 F (36.7 C)   Resp 18   Ht '4\' 6"'$  (1.372 m)   Wt 128 lb 8 oz (58.3 kg)   SpO2 96%   BMI 30.98 kg/m    Physical Examination Physical Exam Constitutional:      Appearance: Normal appearance. She is not ill-appearing.  Cardiovascular:     Rate and Rhythm: Normal rate and regular rhythm.     Pulses: Normal pulses.     Heart sounds: Normal heart sounds. No murmur heard.    No friction rub. No gallop.  Pulmonary:     Effort: Pulmonary effort is normal. No respiratory distress.     Breath sounds: Normal breath sounds. No wheezing, rhonchi or rales.  Abdominal:     General: Abdomen is flat. Bowel sounds are normal. There is no distension.     Palpations: Abdomen is soft.     Tenderness: There is no abdominal tenderness.  Musculoskeletal:     Right lower leg:  Edema present.     Left lower leg: Edema present.  Skin:    General: Skin is warm and dry.     Findings: No rash.     Comments: She has 1-2+ edema in her feet and around her ankles.  Neurological:     Mental Status: She is alert.        Assessment & Plan:   Chronic venous insufficiency She has no pulmonary edema or other problems and she has mild edema in her feet and ankles.  I think she has chronic venous insufficieny.  I want her to elevated her feet, cut sodium and use ace wraps to her knees.    Return in about 3 months (around 06/22/2022) for annual.   Townsend Roger, MD

## 2022-04-13 ENCOUNTER — Encounter: Payer: Self-pay | Admitting: Internal Medicine

## 2022-04-13 ENCOUNTER — Ambulatory Visit: Payer: Medicare Other | Admitting: Internal Medicine

## 2022-04-13 VITALS — BP 118/60 | HR 86 | Temp 98.0°F | Resp 18 | Ht <= 58 in

## 2022-04-13 DIAGNOSIS — R0609 Other forms of dyspnea: Secondary | ICD-10-CM | POA: Insufficient documentation

## 2022-04-13 NOTE — Progress Notes (Signed)
Office Visit  Subjective   Patient ID: [name]   DOB: November 08, 1917   Age: 89 y.o.   MRN: [id number]   Chief Complaint Chief Complaint  Patient presents with   Shortness of Breath     History of Present Illness The patient is a 89 yo female who comes in with complains of DOE.  They noticed this has been going for over 6 months but has worsened over the last 5-6 months.  She is wheelchair bound and states she will get SOB if she exerts herself with transfers.  They also state if she watches a sad story on TV and she will have SOB if she cries.  The patient denies any associated chest pain, SOB at rest, heart palpitations but she still has some lower extremity edema.  I saw her last month and felt she had changes of chronic venous insufficiency.  She did smoke for about 7 years but quit at age 22.  The family also hears some wheezing at times as well.       Past Medical History Past Medical History:  Diagnosis Date   Hypertension      Allergies Allergies  Allergen Reactions   Clinoril [Sulindac] Other (See Comments)    unknown   Doxycycline Other (See Comments)    unknown   Ketoconazole Other (See Comments)    unknown   Naprosyn [Naproxen] Other (See Comments)    unknown   Neosporin [Neomycin-Bacitracin Zn-Polymyx] Other (See Comments)    unknown   Voltaren [Diclofenac Sodium] Other (See Comments)    unknown   Amoxicillin Rash   Penicillins Rash    Did it involve swelling of the face/tongue/throat, SOB, or low BP? No Did it involve sudden or severe rash/hives, skin peeling, or any reaction on the inside of your mouth or nose? Yes Did you need to seek medical attention at a hospital or doctor's office? No When did it last happen?    childhood   If all above answers are "NO", may proceed with cephalosporin use.      Review of Systems Review of Systems  Constitutional:  Negative for chills and fever.  Respiratory:  Negative for cough, hemoptysis and wheezing.    Cardiovascular:  Positive for leg swelling. Negative for chest pain, palpitations, orthopnea and PND.  Gastrointestinal:  Negative for abdominal pain, constipation, diarrhea, nausea and vomiting.  Musculoskeletal:  Negative for myalgias.  Skin:  Negative for itching and rash.  Neurological:  Negative for dizziness and weakness.  Psychiatric/Behavioral:  Negative for depression. The patient is not nervous/anxious.        Objective:    Vitals BP 118/60 (BP Location: Left Arm, Patient Position: Sitting, Cuff Size: Normal)   Pulse 86   Temp 98 F (36.7 C) (Temporal)   Resp 18   Ht '4\' 8"'$  (1.422 m)   SpO2 93%   BMI 28.81 kg/m    Physical Examination Physical Exam Constitutional:      Appearance: Normal appearance. She is well-developed. She is not ill-appearing.  Cardiovascular:     Rate and Rhythm: Normal rate and regular rhythm.     Pulses: Normal pulses.     Heart sounds: No murmur heard.    No friction rub. No gallop.  Abdominal:     General: Abdomen is flat. Bowel sounds are normal. There is no distension.     Palpations: Abdomen is soft.     Tenderness: There is no abdominal tenderness.  Musculoskeletal:  Right lower leg: Edema present.     Left lower leg: Edema present.     Comments: 1+ edema in her ankles/lower legs  Skin:    Findings: No rash.  Neurological:     Mental Status: She is alert.        Assessment & Plan:   DOE (dyspnea on exertion) We will obtain an EKG and CXR on her for evaluation.  I think she is going to need an ECHO and we will refer her over to cardiology for workup.    Return in about 3 months (around 07/13/2022).   Townsend Roger, MD

## 2022-04-13 NOTE — Assessment & Plan Note (Signed)
We will obtain an EKG and CXR on her for evaluation.  I think she is going to need an ECHO and we will refer her over to cardiology for workup.

## 2022-05-10 ENCOUNTER — Other Ambulatory Visit: Payer: Self-pay

## 2022-05-10 MED ORDER — ALBUTEROL SULFATE (2.5 MG/3ML) 0.083% IN NEBU: 2.5000 mg | INHALATION_SOLUTION | RESPIRATORY_TRACT | 2 refills | Status: AC | PRN

## 2022-06-05 ENCOUNTER — Other Ambulatory Visit: Payer: Self-pay

## 2022-06-05 MED ORDER — VALACYCLOVIR HCL 1 G PO TABS: 2000.0000 mg | ORAL_TABLET | Freq: Two times a day (BID) | ORAL | 2 refills | Status: AC

## 2022-06-22 ENCOUNTER — Other Ambulatory Visit: Payer: Self-pay | Admitting: Internal Medicine

## 2022-07-13 ENCOUNTER — Other Ambulatory Visit: Payer: Self-pay | Admitting: Internal Medicine

## 2022-07-18 ENCOUNTER — Ambulatory Visit: Payer: Medicare Other | Admitting: Internal Medicine

## 2022-07-18 ENCOUNTER — Other Ambulatory Visit: Payer: Self-pay

## 2022-07-18 MED ORDER — PREDNISONE 2.5 MG PO TABS: 2.5000 mg | ORAL_TABLET | Freq: Every day | ORAL | 11 refills | Status: DC

## 2022-07-31 DIAGNOSIS — H353132 Nonexudative age-related macular degeneration, bilateral, intermediate dry stage: Secondary | ICD-10-CM | POA: Diagnosis not present

## 2022-07-31 DIAGNOSIS — Z961 Presence of intraocular lens: Secondary | ICD-10-CM | POA: Diagnosis not present

## 2022-09-18 ENCOUNTER — Other Ambulatory Visit: Payer: Self-pay | Admitting: Internal Medicine

## 2022-10-24 ENCOUNTER — Encounter: Payer: Self-pay | Admitting: Internal Medicine

## 2022-10-24 ENCOUNTER — Ambulatory Visit: Payer: Medicare Other | Admitting: Internal Medicine

## 2022-10-24 VITALS — BP 118/80 | HR 94 | Temp 97.7°F | Resp 18 | Ht <= 58 in | Wt 128.0 lb

## 2022-10-24 DIAGNOSIS — R600 Localized edema: Secondary | ICD-10-CM | POA: Diagnosis not present

## 2022-10-24 DIAGNOSIS — S80822A Blister (nonthermal), left lower leg, initial encounter: Secondary | ICD-10-CM | POA: Diagnosis not present

## 2022-10-24 MED ORDER — FUROSEMIDE 20 MG PO TABS: 20.0000 mg | ORAL_TABLET | Freq: Every day | ORAL | 11 refills | Status: AC

## 2022-10-24 NOTE — Assessment & Plan Note (Signed)
I will start her on lasix 20 mg daily and will do BMP on next visit.

## 2022-10-24 NOTE — Progress Notes (Signed)
   Acute Office Visit  Subjective:     Patient ID: [name], female    DOB: [birth year], 89 y.o.   MRN: [id number]  Chief Complaint  Patient presents with   Foot Swelling    HPI Patient is in today for increase edema both legs and over the weekend caregiver noted  That she has black blister left medial leg and black rash on lateral right leg. She denies any pain, she denies any injury or fall. She does not have any pain. She is non ambulatory and sit on chair and mover around on wheelchair. She denies any SOB.   Review of Systems  Constitutional: Negative.   Respiratory: Negative.    Cardiovascular:  Positive for leg swelling.  Gastrointestinal: Negative.         Objective:    BP 118/80 (BP Location: Left Arm, Patient Position: Sitting, Cuff Size: Normal)   Pulse 94   Temp 97.7 F (36.5 C)   Resp 18   Ht 4\' 8"  (1.422 m)   Wt 128 lb (58.1 kg)   SpO2 95%   BMI 28.70 kg/m    Physical Exam Constitutional:      Appearance: Normal appearance.  Cardiovascular:     Rate and Rhythm: Normal rate and regular rhythm.     Heart sounds: Normal heart sounds.  Pulmonary:     Effort: Pulmonary effort is normal.     Breath sounds: Normal breath sounds.  Abdominal:     General: Bowel sounds are normal.     Palpations: Abdomen is soft.  Musculoskeletal:     Right lower leg: Edema present.     Left lower leg: Edema present.  Neurological:     Mental Status: She is alert.     No results found for any visits on 10/24/22.      Assessment & Plan:   Problem List Items Addressed This Visit       Musculoskeletal and Integument   Blister of left lower leg     Other   Bilateral leg edema - Primary    No orders of the defined types were placed in this encounter.   No follow-ups on file.  [name], MD

## 2022-10-24 NOTE — Assessment & Plan Note (Signed)
She has blood blister both legs and I will apply unna boots to change twice a week for 1 month

## 2022-11-14 ENCOUNTER — Encounter: Payer: Self-pay | Admitting: Internal Medicine

## 2022-11-14 ENCOUNTER — Ambulatory Visit: Payer: Medicare Other | Admitting: Internal Medicine

## 2022-11-14 VITALS — BP 122/60 | HR 72 | Temp 97.9°F | Resp 18 | Ht <= 58 in | Wt 128.0 lb

## 2022-11-14 DIAGNOSIS — S80822D Blister (nonthermal), left lower leg, subsequent encounter: Secondary | ICD-10-CM | POA: Diagnosis not present

## 2022-11-14 NOTE — Progress Notes (Signed)
   Office Visit  Subjective   Patient ID: [name]   DOB: 1917/09/05   Age: 89 y.o.   MRN: [id number]   Chief Complaint Chief Complaint  Patient presents with   office visit    Follow up on unna boot application      History of Present Illness 89 years old female who is here for follow-up of bilateral leg edema and venous ulcer.  Unna boot was applied and her swelling is much better.  However, there is a area of blood blister on the lateral left posterior calf.  It is oozing blood.  She denies having any pain.  No other complaint.  Past Medical History Past Medical History:  Diagnosis Date   Hypertension      Allergies Allergies  Allergen Reactions   Clinoril [Sulindac] Other (See Comments)    unknown   Doxycycline Other (See Comments)    unknown   Ketoconazole Other (See Comments)    unknown   Naprosyn [Naproxen] Other (See Comments)    unknown   Neosporin [Neomycin-Bacitracin Zn-Polymyx] Other (See Comments)    unknown   Voltaren [Diclofenac Sodium] Other (See Comments)    unknown   Amoxicillin Rash   Penicillins Rash    Did it involve swelling of the face/tongue/throat, SOB, or low BP? No Did it involve sudden or severe rash/hives, skin peeling, or any reaction on the inside of your mouth or nose? Yes Did you need to seek medical attention at a hospital or doctor's office? No When did it last happen?    childhood   If all above answers are "NO", may proceed with cephalosporin use.      Review of Systems Review of Systems  Cardiovascular:  Positive for leg swelling.  Skin:        Rupture blood blister left leg       Objective:    Vitals BP 122/60 (BP Location: Left Arm, Patient Position: Sitting, Cuff Size: Normal)   Pulse 72   Temp 97.9 F (36.6 C)   Resp 18   Ht 4\' 8"  (1.422 m)   Wt 128 lb (58.1 kg)   SpO2 100%   BMI 28.70 kg/m    Physical Examination Physical Exam Constitutional:      Appearance: Normal appearance.  Skin:     Comments: He has ruptured blood blister left posterior calf.  Neurological:     Mental Status: She is alert.        Assessment & Plan:   Blister of left lower leg Her swelling is much better but she has run blood blister that is oozing blood from the left posterior calf.  I will clean that area with saline and.  Will do daily saline dressing.  If there is any sign of infection.  Will call.  Plan was discussed with the daughter.    Return in about 1 week (around 11/21/2022).   [name], MD

## 2022-11-14 NOTE — Assessment & Plan Note (Signed)
Her swelling is much better but she has run blood blister that is oozing blood from the left posterior calf.  I will clean that area with saline and.  Will do daily saline dressing.  If there is any sign of infection.  Will call.  Plan was discussed with the daughter.

## 2022-11-21 ENCOUNTER — Ambulatory Visit: Payer: Medicare Other | Admitting: Internal Medicine

## 2022-11-23 ENCOUNTER — Encounter: Payer: Self-pay | Admitting: Internal Medicine

## 2022-11-23 ENCOUNTER — Ambulatory Visit: Payer: Medicare Other | Admitting: Internal Medicine

## 2022-11-23 VITALS — BP 116/70 | Temp 97.7°F | Resp 18 | Ht <= 58 in | Wt 128.0 lb

## 2022-11-23 DIAGNOSIS — L97229 Non-pressure chronic ulcer of left calf with unspecified severity: Secondary | ICD-10-CM | POA: Diagnosis not present

## 2022-11-23 DIAGNOSIS — I83022 Varicose veins of left lower extremity with ulcer of calf: Secondary | ICD-10-CM

## 2022-11-23 NOTE — Assessment & Plan Note (Signed)
I am going to ask homehealth wound care to come in and do wound care on this posterior calf venous stasis ulcer.  She probably needs another unna boot/elastic bandage done on this leg.  There is no evidence of wound infection.

## 2022-11-23 NOTE — Progress Notes (Signed)
Office Visit  Subjective   Patient ID: [name]   DOB: Feb 04, 1918   Age: 89 y.o.   MRN: [id number]   Chief Complaint Chief Complaint  Patient presents with   Follow-up    1 month follow up new open wound on left leg and another non healing wound on same leg     History of Present Illness The patient is a 89 years old female who is here for follow-up of bilateral leg edema and venous stasis ulcer where she saw Dr. Nelson Chimes last week and he wanted her to followup in 1 week.  She has 2 small pressure ulcers of her left lower leg- one located of her anterior shin that is the size of a pencil eraser and another located medially to that lesion that is even smaller.  There is no drainage erythema, swelling or pain.  However, she has a 3rd ulcer/lesion located on her posterior calf measuring about the size of a quarter where she states this bleeds at times and is not healing.  There is some serosanginous drainage but no erythema or swelling of this ulcer.   She was seen several weeks ago by Dr. Nelson Chimes who applied a Unna boot/double elastic bandage which helped heal the lesions she currently has.  She comes in today because of this left posterior calf venous stasis ulcer.       Past Medical History Past Medical History:  Diagnosis Date   Hypertension      Allergies Allergies  Allergen Reactions   Clinoril [Sulindac] Other (See Comments)    unknown   Doxycycline Other (See Comments)    unknown   Ketoconazole Other (See Comments)    unknown   Naprosyn [Naproxen] Other (See Comments)    unknown   Neosporin [Neomycin-Bacitracin Zn-Polymyx] Other (See Comments)    unknown   Voltaren [Diclofenac Sodium] Other (See Comments)    unknown   Amoxicillin Rash   Penicillins Rash    Did it involve swelling of the face/tongue/throat, SOB, or low BP? No Did it involve sudden or severe rash/hives, skin peeling, or any reaction on the inside of your mouth or nose? Yes Did you need to seek medical  attention at a hospital or doctor's office? No When did it last happen?    childhood   If all above answers are "NO", may proceed with cephalosporin use.      Medications  Current Outpatient Medications:    acetaminophen (TYLENOL) 500 MG tablet, Take 500 mg by mouth every 6 (six) hours as needed for mild pain. , Disp: , Rfl:    albuterol (PROVENTIL HFA;VENTOLIN HFA) 108 (90 BASE) MCG/ACT inhaler, Inhale into the lungs every 6 (six) hours as needed for wheezing or shortness of breath., Disp: , Rfl:    albuterol (PROVENTIL) (2.5 MG/3ML) 0.083% nebulizer solution, Take 3 mLs (2.5 mg total) by nebulization every 4 (four) hours as needed for wheezing or shortness of breath., Disp: 75 mL, Rfl: 2   calcium carbonate (OS-CAL) 600 MG TABS, Take 600 mg by mouth daily., Disp: , Rfl:    furosemide (LASIX) 20 MG tablet, TAKE 1 TABLET BY MOUTH ONCE DAILY, Disp: 90 tablet, Rfl: 2   furosemide (LASIX) 20 MG tablet, Take 1 tablet (20 mg total) by mouth daily., Disp: 30 tablet, Rfl: 11   metoprolol succinate (TOPROL-XL) 25 MG 24 hr tablet, Take 0.5 tablets (12.5 mg total) by mouth daily., Disp: 15 tablet, Rfl: 0   nitroGLYCERIN (NITROSTAT) 0.4 MG SL  tablet, Place 0.4 mg under the tongue every 5 (five) minutes as needed for chest pain., Disp: , Rfl:    pantoprazole (PROTONIX) 40 MG tablet, Take 1 tablet (40 mg total) by mouth 2 (two) times daily., Disp: 60 tablet, Rfl: 0   potassium chloride (KLOR-CON M) 10 MEQ tablet, TAKE 1 TABLET BY MOUTH ONCE DAILY *TAKE WITH FOOD* *DO NOT CRUSH OR CHEW* *MAY DISSOLVE*, Disp: 30 tablet, Rfl: 10   predniSONE (DELTASONE) 2.5 MG tablet, Take 1 tablet (2.5 mg total) by mouth daily with breakfast., Disp: 45 tablet, Rfl: 11   Probiotic Product (PROBIOTIC DAILY PO), Take by mouth., Disp: , Rfl:    vitamin C (ASCORBIC ACID) 500 MG tablet, Take 500 mg by mouth daily., Disp: , Rfl:    Review of Systems Review of Systems  Constitutional:  Negative for chills and fever.   Cardiovascular:  Negative for chest pain, palpitations and leg swelling.  Gastrointestinal:  Negative for nausea and vomiting.  Skin:  Negative for rash.  Neurological:  Negative for dizziness, weakness and headaches.       Objective:    Vitals BP 116/70 (BP Location: Left Arm, Patient Position: Sitting, Cuff Size: Normal)   Temp 97.7 F (36.5 C)   Resp 18   Ht 4\' 8"  (1.422 m)   Wt 128 lb (58.1 kg)   BMI 28.70 kg/m    Physical Examination Physical Exam Constitutional:      Appearance: Normal appearance. She is not ill-appearing.  Cardiovascular:     Rate and Rhythm: Normal rate and regular rhythm.     Pulses: Normal pulses.     Heart sounds: No murmur heard.    No friction rub. No gallop.  Pulmonary:     Effort: Pulmonary effort is normal. No respiratory distress.     Breath sounds: No wheezing, rhonchi or rales.  Abdominal:     General: Bowel sounds are normal. There is no distension.     Palpations: Abdomen is soft.     Tenderness: There is no abdominal tenderness.  Musculoskeletal:     Right lower leg: No edema.     Left lower leg: No edema.  Skin:    General: Skin is warm and dry.     Findings: No rash.     Comments: She has one anterior shin venous ulcer the size of a pencil eraser without surrounding erythema.  There is a similar size lesion more medial to this lesion.  She has a 3rd lesion of her left posterior calf that has a bleeding base without necrosis or surrounding erythema.  Neurological:     Mental Status: She is alert.        Assessment & Plan:   Venous stasis ulcer of left calf with varicose veins (HCC) I am going to ask homehealth wound care to come in and do wound care on this posterior calf venous stasis ulcer.  She probably needs another unna boot/elastic bandage done on this leg.  There is no evidence of wound infection.    No follow-ups on file.   Crist Fat, MD

## 2022-11-27 DIAGNOSIS — Z79891 Long term (current) use of opiate analgesic: Secondary | ICD-10-CM | POA: Diagnosis not present

## 2022-11-27 DIAGNOSIS — L97229 Non-pressure chronic ulcer of left calf with unspecified severity: Secondary | ICD-10-CM | POA: Diagnosis not present

## 2022-11-27 DIAGNOSIS — Z7952 Long term (current) use of systemic steroids: Secondary | ICD-10-CM | POA: Diagnosis not present

## 2022-11-27 DIAGNOSIS — Z556 Problems related to health literacy: Secondary | ICD-10-CM | POA: Diagnosis not present

## 2022-11-27 DIAGNOSIS — Z993 Dependence on wheelchair: Secondary | ICD-10-CM | POA: Diagnosis not present

## 2022-11-27 DIAGNOSIS — I10 Essential (primary) hypertension: Secondary | ICD-10-CM | POA: Diagnosis not present

## 2022-11-27 DIAGNOSIS — I83022 Varicose veins of left lower extremity with ulcer of calf: Secondary | ICD-10-CM | POA: Diagnosis not present

## 2022-11-28 ENCOUNTER — Encounter: Payer: Self-pay | Admitting: Internal Medicine

## 2022-11-28 ENCOUNTER — Ambulatory Visit: Payer: Medicare Other | Admitting: Internal Medicine

## 2022-11-28 VITALS — BP 120/70 | Temp 97.7°F | Resp 18 | Ht <= 58 in | Wt 128.0 lb

## 2022-11-28 DIAGNOSIS — L249 Irritant contact dermatitis, unspecified cause: Secondary | ICD-10-CM

## 2022-11-28 DIAGNOSIS — S81802A Unspecified open wound, left lower leg, initial encounter: Secondary | ICD-10-CM | POA: Insufficient documentation

## 2022-11-28 DIAGNOSIS — S81802D Unspecified open wound, left lower leg, subsequent encounter: Secondary | ICD-10-CM | POA: Diagnosis not present

## 2022-11-28 DIAGNOSIS — H6123 Impacted cerumen, bilateral: Secondary | ICD-10-CM | POA: Diagnosis not present

## 2022-11-28 MED ORDER — DEBROX 6.5 % OT SOLN: 5.0000 [drp] | Freq: Two times a day (BID) | OTIC | 0 refills | Status: AC

## 2022-11-28 MED ORDER — CEPHALEXIN 500 MG PO CAPS: 500.0000 mg | ORAL_CAPSULE | Freq: Four times a day (QID) | ORAL | 0 refills | Status: AC

## 2022-11-28 NOTE — Progress Notes (Unsigned)
Office Visit  Subjective   Patient ID: [name]   DOB: 04-Oct-1917   Age: 89 y.o.   MRN: [id number]   Chief Complaint Chief Complaint  Patient presents with   office visit    Patient here for leg wound check     History of Present Illness 89 years old female who is here for follow up. She is getting [place] boat twice a week. Her swelling and all her wounds have healed except one wound on back of left leg that is draining pus like drainage.   Daughter think she may have wax in it as she does not hear well in spite of using hearing aids.   Daughter also says that she has scaly rash on her right side of head that has whitish scale. No sure how long she has that lesion.   Past Medical History Past Medical History:  Diagnosis Date   Hypertension      Allergies Allergies  Allergen Reactions   Clinoril [Sulindac] Other (See Comments)    unknown   Doxycycline Other (See Comments)    unknown   Ketoconazole Other (See Comments)    unknown   Naprosyn [Naproxen] Other (See Comments)    unknown   Neosporin [Neomycin-Bacitracin Zn-Polymyx] Other (See Comments)    unknown   Voltaren [Diclofenac Sodium] Other (See Comments)    unknown   Amoxicillin Rash   Penicillins Rash    Did it involve swelling of the face/tongue/throat, SOB, or low BP? No Did it involve sudden or severe rash/hives, skin peeling, or any reaction on the inside of your mouth or nose? Yes Did you need to seek medical attention at a hospital or doctor's office? No When did it last happen?    childhood   If all above answers are "NO", may proceed with cephalosporin use.      Review of Systems Review of Systems  HENT:  Positive for hearing loss.   Skin:  Positive for rash.       Wound on her left post leg       Objective:    Vitals BP 120/70 (BP Location: Left Arm, Patient Position: Sitting, Cuff Size: Normal)   Temp 97.7 F (36.5 C)   Resp 18   Ht 4\' 8"  (1.422 m)   Wt 128 lb (58.1 kg)   BMI  28.70 kg/m    Physical Examination Physical Exam Constitutional:      Appearance: Normal appearance.  HENT:     Right Ear: There is impacted cerumen.     Left Ear: There is impacted cerumen.  Musculoskeletal:     Comments: She also has ulcer on her left posterior leg that is draining fluid.  Skin:    Comments: White scale like dermatitis right side head.   Neurological:     Mental Status: She is alert.        Assessment & Plan:   Wound of left leg Wound started after blood blister was ruptured.  She is getting dressing twice a week but wound is draining increased secretion.  I will start cephalexin every 6 hour for 7 days.  Staff will do daily dressing.  (Note I have spoken with [facility] pharmacist and patient has received this antibiotic before and there was no side effect or reaction from this antibiotic.)  If wound is not better then may need to refer her to wound care center.  Irritant contact dermatitis of scalp This might be dandruff related versus seborrheic keratosis.  I will refer her to see dermatologist.  Bilateral impacted cerumen She will put Debrox 5 drops to both ear for 7 days and then next week we will do ear irrigation if she still has wax in it.    Return in about 2 weeks (around 12/12/2022).   [name], MD

## 2022-11-29 NOTE — Assessment & Plan Note (Signed)
Wound started after blood blister was ruptured.  She is getting dressing twice a week but wound is draining increased secretion.  I will start cephalexin every 6 hour for 7 days.  Staff will do daily dressing.  (Note I have spoken with [facility] pharmacist and patient has received this antibiotic before and there was no side effect or reaction from this antibiotic.)  If wound is not better then may need to refer her to wound care center.

## 2022-11-29 NOTE — Assessment & Plan Note (Addendum)
This might be dandruff related versus seborrheic keratosis.  I will refer her to see dermatologist.

## 2022-11-29 NOTE — Assessment & Plan Note (Signed)
She will put Debrox 5 drops to both ear for 7 days and then next week we will do ear irrigation if she still has wax in it.

## 2022-12-05 ENCOUNTER — Ambulatory Visit: Payer: Medicare Other | Admitting: Internal Medicine

## 2022-12-05 VITALS — BP 118/60 | HR 76 | Temp 97.9°F | Resp 18 | Ht <= 58 in | Wt 128.0 lb

## 2022-12-05 DIAGNOSIS — L97221 Non-pressure chronic ulcer of left calf limited to breakdown of skin: Secondary | ICD-10-CM

## 2022-12-05 DIAGNOSIS — I83022 Varicose veins of left lower extremity with ulcer of calf: Secondary | ICD-10-CM

## 2022-12-05 DIAGNOSIS — H6123 Impacted cerumen, bilateral: Secondary | ICD-10-CM | POA: Diagnosis not present

## 2022-12-05 NOTE — Progress Notes (Signed)
   Office Visit  Subjective   Patient ID: [name]   DOB: 1917/05/20   Age: 89 y.o.   MRN: [id number]   Chief Complaint Chief Complaint  Patient presents with   Follow-up    2 week follow up ear wax removal     History of Present Illness 89 years old female who is here for follow-up of ear irrigation and stopped want me to look at her left leg wound also.  [name].  In spite of using hearing aid she cannot hear. Staff from [facility] is doing daily dressing and she is completing antibiotic course.  She denies any other complaint.  Past Medical History Past Medical History:  Diagnosis Date   Hypertension      Allergies Allergies  Allergen Reactions   Clinoril [Sulindac] Other (See Comments)    unknown   Doxycycline Other (See Comments)    unknown   Ketoconazole Other (See Comments)    unknown   Naprosyn [Naproxen] Other (See Comments)    unknown   Neosporin [Neomycin-Bacitracin Zn-Polymyx] Other (See Comments)    unknown   Voltaren [Diclofenac Sodium] Other (See Comments)    unknown   Amoxicillin Rash   Penicillins Rash    Did it involve swelling of the face/tongue/throat, SOB, or low BP? No Did it involve sudden or severe rash/hives, skin peeling, or any reaction on the inside of your mouth or nose? Yes Did you need to seek medical attention at a hospital or doctor's office? No When did it last happen?    childhood   If all above answers are "NO", may proceed with cephalosporin use.      Review of Systems Review of Systems  HENT:  Positive for hearing loss.   Cardiovascular: Negative.        Objective:    Vitals BP 118/60 (BP Location: Left Arm, Patient Position: Sitting, Cuff Size: Normal)   Pulse 76   Temp 97.9 F (36.6 C)   Resp 18   Ht 4\' 8"  (1.422 m)   Wt 128 lb (58.1 kg)   SpO2 93%   BMI 28.70 kg/m    Physical Examination Physical Exam Constitutional:      Appearance: Normal appearance.   HENT:     Right Ear: There is impacted cerumen.     Left Ear: There is impacted cerumen.  Musculoskeletal:     Comments: Wound left post leg is looking better.  Neurological:     Mental Status: She is alert.        Assessment & Plan:   Venous stasis ulcer of left calf with varicose veins (HCC) [name].  I have discussed with the daughter that homeless people can change dressing twice a week but she rather stopped from here do daily dressing as that has helped her more.  Bilateral impacted cerumen Wax was removed from both years with irrigation and manually.  Her ear tube being also came out with the wax.  I have informed the daughter about both ear tubes came out.  The patient can hear much better now.   If the wound did not heal after 2 weeks then she will come back.  No follow-ups on file.   [name], MD

## 2022-12-05 NOTE — Assessment & Plan Note (Addendum)
Wax was removed from both years with irrigation and manually.  Her ear tube being also came out with the wax.  I have informed the daughter about both ear tubes came out.  The patient can hear much better now.

## 2022-12-05 NOTE — Assessment & Plan Note (Signed)
[name].  I have discussed with the daughter that homeless people can change dressing twice a week but she rather stopped from here do daily dressing as that has helped her more.

## 2022-12-12 ENCOUNTER — Ambulatory Visit: Payer: Medicare Other | Admitting: Internal Medicine

## 2023-01-09 ENCOUNTER — Ambulatory Visit: Payer: Medicare Other | Admitting: Internal Medicine

## 2023-02-06 ENCOUNTER — Ambulatory Visit: Payer: Medicare Other | Admitting: Internal Medicine

## 2023-02-06 ENCOUNTER — Encounter: Payer: Self-pay | Admitting: Internal Medicine

## 2023-02-06 VITALS — BP 122/70 | HR 68 | Temp 98.0°F | Resp 18 | Ht <= 58 in | Wt 128.0 lb

## 2023-02-06 DIAGNOSIS — M79642 Pain in left hand: Secondary | ICD-10-CM | POA: Insufficient documentation

## 2023-02-06 DIAGNOSIS — J309 Allergic rhinitis, unspecified: Secondary | ICD-10-CM | POA: Diagnosis not present

## 2023-02-06 MED ORDER — PREDNISONE 5 MG PO TABS: 5.0000 mg | ORAL_TABLET | Freq: Every day | ORAL | 4 refills | Status: AC

## 2023-02-06 MED ORDER — FLUTICASONE PROPIONATE 50 MCG/ACT NA SUSP: 1.0000 | Freq: Every day | NASAL | 2 refills | Status: DC

## 2023-02-06 MED ORDER — FLUTICASONE PROPIONATE 50 MCG/ACT NA SUSP: 1.0000 | Freq: Every day | NASAL | 2 refills | Status: AC

## 2023-02-06 MED ORDER — PANTOPRAZOLE SODIUM 20 MG PO TBEC: 20.0000 mg | DELAYED_RELEASE_TABLET | Freq: Every day | ORAL | 4 refills | Status: AC

## 2023-02-06 NOTE — Assessment & Plan Note (Signed)
She will increase fluid intake, She will also use fluticasone nasal spray at night.

## 2023-02-06 NOTE — Progress Notes (Addendum)
Office Visit  Subjective   Patient ID: [name]   DOB: 04/26/1917   Age: 89 y.o.   MRN: [id number]   Chief Complaint Chief Complaint  Patient presents with   office visit    Arthritic pain      History of Present Illness 89 years old female is here c/o increase pain in her left hand. 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 No tenderness. No fall. She takes prednisone 2.5 mg daily. She was brought by her daughter and care giver.   She also is c/o stuffy nose and sore throat. Sh has seasonal allergies but have not been using nasal spray.   Past Medical History Past Medical History:  Diagnosis Date   Hypertension      Allergies Allergies  Allergen Reactions   Clinoril [Sulindac] Other (See Comments)    unknown   Doxycycline Other (See Comments)    unknown   Ketoconazole Other (See Comments)    unknown   Naprosyn [Naproxen] Other (See Comments)    unknown   Neosporin [Neomycin-Bacitracin Zn-Polymyx] Other (See Comments)    unknown   Voltaren [Diclofenac Sodium] Other (See Comments)    unknown   Amoxicillin Rash   Penicillins Rash    Did it involve swelling of the face/tongue/throat, SOB, or low BP? No Did it involve sudden or severe rash/hives, skin peeling, or any reaction on the inside of your mouth or nose? Yes Did you need to seek medical attention at a hospital or doctor's office? No When did it last happen?    childhood   If all above answers are "NO", may proceed with cephalosporin use.      Review of Systems Review of Systems  Constitutional: Negative.   HENT:  Positive for sore throat.   Respiratory: Negative.    Cardiovascular: Negative.   Musculoskeletal:  Positive for joint pain.       Objective:    Vitals BP 122/70 (BP Location: Left Arm, Patient Position: Sitting, Cuff Size: Normal)   Pulse 68   Temp 98 F (36.7 C)   Resp 18   Ht 4\' 8"  (1.422 m)   Wt 128 lb (58.1 kg)   SpO2 96%   BMI 28.70 kg/m    Physical  Examination Physical Exam Constitutional:      Appearance: Normal appearance.  HENT:     Mouth/Throat:     Comments: Post nasal drip resent. Throat not erythematous. Pulmonary:     Effort: Pulmonary effort is normal.     Breath sounds: Normal breath sounds.  Musculoskeletal:     Comments: Arthritis of both hands  Neurological:     Mental Status: She is alert.        Assessment & Plan:   Left hand pain Due to arthritis, I will increase the dose of prednisone 5 mg daily  Allergic rhinitis She will increase fluid intake, She will also use fluticasone nasal spray at night.    No follow-ups on file.   [name], MD

## 2023-02-06 NOTE — Addendum Note (Signed)
Addended byEloisa Northern on: 02/06/2023 03:52 PM   Modules accepted: Orders

## 2023-02-06 NOTE — Assessment & Plan Note (Signed)
Due to arthritis, I will increase the dose of prednisone 5 mg daily

## 2023-02-06 NOTE — Addendum Note (Signed)
Addended byEloisa Northern on: 02/06/2023 07:37 PM   Modules accepted: Orders

## 2023-02-13 ENCOUNTER — Encounter: Payer: Self-pay | Admitting: Internal Medicine

## 2023-02-13 ENCOUNTER — Ambulatory Visit: Payer: Medicare Other | Admitting: Internal Medicine

## 2023-02-13 VITALS — BP 122/70 | HR 89 | Temp 97.8°F | Resp 18 | Ht <= 58 in | Wt 128.0 lb

## 2023-02-13 DIAGNOSIS — J209 Acute bronchitis, unspecified: Secondary | ICD-10-CM | POA: Insufficient documentation

## 2023-02-13 MED ORDER — GUAIFENESIN-CODEINE 100-10 MG/5ML PO SOLN: 10.0000 mL | Freq: Three times a day (TID) | ORAL | 0 refills | Status: AC | PRN

## 2023-02-13 MED ORDER — AZITHROMYCIN 250 MG PO TABS: ORAL_TABLET | ORAL | 0 refills | Status: AC

## 2023-02-13 NOTE — Progress Notes (Addendum)
   Office Visit  Subjective   Patient ID: [name]   DOB: 14-Oct-1917   Age: 89 y.o.   MRN: [id number]   Chief Complaint Chief Complaint  Patient presents with   office visit    Patient have cold symptoms since 4 days no fever  Coughing      History of Present Illness 89 years old female who is here with her caregiver and daughter who reports that patient has increased cough for the last 4 days.  She could not complete her sentence in the office because of cough and during examination.  Per caregiver she cough all night.  She has no fever or chills.  She has nebulizer machine at home with DuoNeb and cannot get nebulizer treatment.  She denies any wheezing.  Family has done COVID test prior to coming here and that was negative.  She was COVID-positive few months ago.  Past Medical History Past Medical History:  Diagnosis Date   Hypertension      Allergies Allergies  Allergen Reactions   Clinoril [Sulindac] Other (See Comments)    unknown   Doxycycline Other (See Comments)    unknown   Ketoconazole Other (See Comments)    unknown   Naprosyn [Naproxen] Other (See Comments)    unknown   Neosporin [Neomycin-Bacitracin Zn-Polymyx] Other (See Comments)    unknown   Voltaren [Diclofenac Sodium] Other (See Comments)    unknown   Amoxicillin Rash   Penicillins Rash    Did it involve swelling of the face/tongue/throat, SOB, or low BP? No Did it involve sudden or severe rash/hives, skin peeling, or any reaction on the inside of your mouth or nose? Yes Did you need to seek medical attention at a hospital or doctor's office? No When did it last happen?    childhood   If all above answers are "NO", may proceed with cephalosporin use.      Review of Systems Review of Systems  Constitutional: Negative.   HENT:  Positive for congestion.   Respiratory:  Positive for cough and shortness of breath.   Cardiovascular: Negative.   Gastrointestinal: Negative.        Objective:     Vitals BP 122/70 (BP Location: Left Arm, Patient Position: Sitting, Cuff Size: Normal)   Pulse 89   Temp 97.8 F (36.6 C)   Resp 18   Ht 4\' 8"  (1.422 m)   Wt 128 lb (58.1 kg)   SpO2 90%   BMI 28.70 kg/m    Physical Examination Physical Exam Constitutional:      General: She is in acute distress.  Cardiovascular:     Rate and Rhythm: Normal rate and regular rhythm.  Pulmonary:     Breath sounds: Wheezing and rales present.  Neurological:     Mental Status: She is alert.        Assessment & Plan:   Acute bronchitis She has wheezing and Rales on examination.  I have discussed with the daughter that I will do a chest x-ray and start her on Z-Pak and cough syrup.  We will reevaluate after chest x-ray and if chest x-ray is positive I will add another antibiotic.  Daughter and agree with the plan.  They will give nebulizer treatment at home.    No follow-ups on file.   [name], MD

## 2023-02-14 DIAGNOSIS — J90 Pleural effusion, not elsewhere classified: Secondary | ICD-10-CM | POA: Diagnosis not present

## 2023-02-15 MED ORDER — CEFDINIR 300 MG PO CAPS: 300.0000 mg | ORAL_CAPSULE | Freq: Two times a day (BID) | ORAL | 0 refills | Status: AC

## 2023-02-15 NOTE — Addendum Note (Signed)
Addended byEloisa Northern on: 02/15/2023 03:09 PM   Modules accepted: Orders

## 2023-02-17 ENCOUNTER — Telehealth: Payer: Self-pay | Admitting: Internal Medicine

## 2023-02-17 DIAGNOSIS — M1990 Unspecified osteoarthritis, unspecified site: Secondary | ICD-10-CM | POA: Diagnosis not present

## 2023-02-17 DIAGNOSIS — I10 Essential (primary) hypertension: Secondary | ICD-10-CM | POA: Diagnosis not present

## 2023-02-17 DIAGNOSIS — M5432 Sciatica, left side: Secondary | ICD-10-CM | POA: Diagnosis not present

## 2023-02-17 DIAGNOSIS — J189 Pneumonia, unspecified organism: Secondary | ICD-10-CM | POA: Diagnosis not present

## 2023-02-17 DIAGNOSIS — D649 Anemia, unspecified: Secondary | ICD-10-CM | POA: Diagnosis not present

## 2023-02-17 DIAGNOSIS — M545 Low back pain, unspecified: Secondary | ICD-10-CM | POA: Diagnosis not present

## 2023-02-17 DIAGNOSIS — J209 Acute bronchitis, unspecified: Secondary | ICD-10-CM | POA: Diagnosis not present

## 2023-02-17 DIAGNOSIS — J9601 Acute respiratory failure with hypoxia: Secondary | ICD-10-CM | POA: Diagnosis not present

## 2023-02-17 DIAGNOSIS — E876 Hypokalemia: Secondary | ICD-10-CM | POA: Diagnosis not present

## 2023-02-17 DIAGNOSIS — I872 Venous insufficiency (chronic) (peripheral): Secondary | ICD-10-CM | POA: Diagnosis not present

## 2023-02-17 DIAGNOSIS — J309 Allergic rhinitis, unspecified: Secondary | ICD-10-CM | POA: Diagnosis not present

## 2023-02-17 DIAGNOSIS — K219 Gastro-esophageal reflux disease without esophagitis: Secondary | ICD-10-CM | POA: Diagnosis not present

## 2023-02-17 NOTE — Progress Notes (Signed)
On November 7 I have discussed with her daughter about chest x-ray result that shows that she has retro cardiac infiltrate.  She told me that her mother still has a lot of cough and she is not eating and drinking much.  [facility]  I have told her that she has pneumonia and she is not eating and drinking much it is better to take her to [facility] for labs and possible CT scan chest.  If she is stable then she can bring [facility] otherwise they may admit her to hospital.  Her daughter wanted to discuss with the rest of the family and they will decide.  She want me to send another antibiotic that I have discussed with her yesterday.  She has taken cefazolin before and I will start her on cefdinir 300 mg twice a day as well.  Family will decide and they will let me know and I have given my phone number to her daughter.

## 2023-02-17 NOTE — Assessment & Plan Note (Signed)
She has wheezing and Rales on examination.  I have discussed with the daughter that I will do a chest x-ray and start her on Z-Pak and cough syrup.  We will reevaluate after chest x-ray and if chest x-ray is positive I will add another antibiotic.  Daughter and agree with the plan.  They will give nebulizer treatment at home.

## 2023-02-19 DIAGNOSIS — J189 Pneumonia, unspecified organism: Secondary | ICD-10-CM | POA: Diagnosis not present

## 2023-02-19 DIAGNOSIS — J309 Allergic rhinitis, unspecified: Secondary | ICD-10-CM | POA: Diagnosis not present

## 2023-02-19 DIAGNOSIS — I872 Venous insufficiency (chronic) (peripheral): Secondary | ICD-10-CM | POA: Diagnosis not present

## 2023-02-19 DIAGNOSIS — J209 Acute bronchitis, unspecified: Secondary | ICD-10-CM | POA: Diagnosis not present

## 2023-02-19 DIAGNOSIS — J9601 Acute respiratory failure with hypoxia: Secondary | ICD-10-CM | POA: Diagnosis not present

## 2023-02-19 DIAGNOSIS — E876 Hypokalemia: Secondary | ICD-10-CM | POA: Diagnosis not present

## 2023-02-20 ENCOUNTER — Other Ambulatory Visit: Payer: Self-pay | Admitting: Internal Medicine

## 2023-02-20 MED ORDER — LINACLOTIDE 145 MCG PO CAPS: 145.0000 ug | ORAL_CAPSULE | Freq: Every day | ORAL | 3 refills | Status: AC

## 2023-02-21 DIAGNOSIS — J9601 Acute respiratory failure with hypoxia: Secondary | ICD-10-CM | POA: Diagnosis not present

## 2023-02-21 DIAGNOSIS — I872 Venous insufficiency (chronic) (peripheral): Secondary | ICD-10-CM | POA: Diagnosis not present

## 2023-02-21 DIAGNOSIS — J309 Allergic rhinitis, unspecified: Secondary | ICD-10-CM | POA: Diagnosis not present

## 2023-02-21 DIAGNOSIS — E876 Hypokalemia: Secondary | ICD-10-CM | POA: Diagnosis not present

## 2023-02-21 DIAGNOSIS — J209 Acute bronchitis, unspecified: Secondary | ICD-10-CM | POA: Diagnosis not present

## 2023-02-21 DIAGNOSIS — J189 Pneumonia, unspecified organism: Secondary | ICD-10-CM | POA: Diagnosis not present

## 2023-02-23 DIAGNOSIS — E876 Hypokalemia: Secondary | ICD-10-CM | POA: Diagnosis not present

## 2023-02-23 DIAGNOSIS — I872 Venous insufficiency (chronic) (peripheral): Secondary | ICD-10-CM | POA: Diagnosis not present

## 2023-02-23 DIAGNOSIS — J309 Allergic rhinitis, unspecified: Secondary | ICD-10-CM | POA: Diagnosis not present

## 2023-02-23 DIAGNOSIS — J9601 Acute respiratory failure with hypoxia: Secondary | ICD-10-CM | POA: Diagnosis not present

## 2023-02-23 DIAGNOSIS — J209 Acute bronchitis, unspecified: Secondary | ICD-10-CM | POA: Diagnosis not present

## 2023-02-23 DIAGNOSIS — J189 Pneumonia, unspecified organism: Secondary | ICD-10-CM | POA: Diagnosis not present

## 2023-02-24 DIAGNOSIS — J9601 Acute respiratory failure with hypoxia: Secondary | ICD-10-CM | POA: Diagnosis not present

## 2023-02-24 DIAGNOSIS — J209 Acute bronchitis, unspecified: Secondary | ICD-10-CM | POA: Diagnosis not present

## 2023-02-24 DIAGNOSIS — J309 Allergic rhinitis, unspecified: Secondary | ICD-10-CM | POA: Diagnosis not present

## 2023-02-24 DIAGNOSIS — E876 Hypokalemia: Secondary | ICD-10-CM | POA: Diagnosis not present

## 2023-02-24 DIAGNOSIS — J189 Pneumonia, unspecified organism: Secondary | ICD-10-CM | POA: Diagnosis not present

## 2023-02-24 DIAGNOSIS — I872 Venous insufficiency (chronic) (peripheral): Secondary | ICD-10-CM | POA: Diagnosis not present

## 2023-02-26 ENCOUNTER — Encounter: Payer: Self-pay | Admitting: Internal Medicine

## 2023-02-28 DIAGNOSIS — J189 Pneumonia, unspecified organism: Secondary | ICD-10-CM | POA: Diagnosis not present

## 2023-02-28 DIAGNOSIS — J209 Acute bronchitis, unspecified: Secondary | ICD-10-CM | POA: Diagnosis not present

## 2023-02-28 DIAGNOSIS — I872 Venous insufficiency (chronic) (peripheral): Secondary | ICD-10-CM | POA: Diagnosis not present

## 2023-02-28 DIAGNOSIS — J309 Allergic rhinitis, unspecified: Secondary | ICD-10-CM | POA: Diagnosis not present

## 2023-02-28 DIAGNOSIS — J9601 Acute respiratory failure with hypoxia: Secondary | ICD-10-CM | POA: Diagnosis not present

## 2023-02-28 DIAGNOSIS — E876 Hypokalemia: Secondary | ICD-10-CM | POA: Diagnosis not present

## 2023-03-02 DIAGNOSIS — J309 Allergic rhinitis, unspecified: Secondary | ICD-10-CM | POA: Diagnosis not present

## 2023-03-02 DIAGNOSIS — J209 Acute bronchitis, unspecified: Secondary | ICD-10-CM | POA: Diagnosis not present

## 2023-03-02 DIAGNOSIS — J189 Pneumonia, unspecified organism: Secondary | ICD-10-CM | POA: Diagnosis not present

## 2023-03-02 DIAGNOSIS — I872 Venous insufficiency (chronic) (peripheral): Secondary | ICD-10-CM | POA: Diagnosis not present

## 2023-03-02 DIAGNOSIS — J9601 Acute respiratory failure with hypoxia: Secondary | ICD-10-CM | POA: Diagnosis not present

## 2023-03-02 DIAGNOSIS — E876 Hypokalemia: Secondary | ICD-10-CM | POA: Diagnosis not present

## 2023-03-06 DIAGNOSIS — J209 Acute bronchitis, unspecified: Secondary | ICD-10-CM | POA: Diagnosis not present

## 2023-03-06 DIAGNOSIS — J309 Allergic rhinitis, unspecified: Secondary | ICD-10-CM | POA: Diagnosis not present

## 2023-03-06 DIAGNOSIS — J9601 Acute respiratory failure with hypoxia: Secondary | ICD-10-CM | POA: Diagnosis not present

## 2023-03-06 DIAGNOSIS — I872 Venous insufficiency (chronic) (peripheral): Secondary | ICD-10-CM | POA: Diagnosis not present

## 2023-03-06 DIAGNOSIS — J189 Pneumonia, unspecified organism: Secondary | ICD-10-CM | POA: Diagnosis not present

## 2023-03-06 DIAGNOSIS — E876 Hypokalemia: Secondary | ICD-10-CM | POA: Diagnosis not present

## 2023-03-07 DIAGNOSIS — J209 Acute bronchitis, unspecified: Secondary | ICD-10-CM | POA: Diagnosis not present

## 2023-03-07 DIAGNOSIS — E876 Hypokalemia: Secondary | ICD-10-CM | POA: Diagnosis not present

## 2023-03-07 DIAGNOSIS — I872 Venous insufficiency (chronic) (peripheral): Secondary | ICD-10-CM | POA: Diagnosis not present

## 2023-03-07 DIAGNOSIS — J309 Allergic rhinitis, unspecified: Secondary | ICD-10-CM | POA: Diagnosis not present

## 2023-03-07 DIAGNOSIS — J9601 Acute respiratory failure with hypoxia: Secondary | ICD-10-CM | POA: Diagnosis not present

## 2023-03-07 DIAGNOSIS — J189 Pneumonia, unspecified organism: Secondary | ICD-10-CM | POA: Diagnosis not present

## 2023-03-11 DIAGNOSIS — M5432 Sciatica, left side: Secondary | ICD-10-CM | POA: Diagnosis not present

## 2023-03-11 DIAGNOSIS — J209 Acute bronchitis, unspecified: Secondary | ICD-10-CM | POA: Diagnosis not present

## 2023-03-11 DIAGNOSIS — M545 Low back pain, unspecified: Secondary | ICD-10-CM | POA: Diagnosis not present

## 2023-03-11 DIAGNOSIS — I872 Venous insufficiency (chronic) (peripheral): Secondary | ICD-10-CM | POA: Diagnosis not present

## 2023-03-11 DIAGNOSIS — K219 Gastro-esophageal reflux disease without esophagitis: Secondary | ICD-10-CM | POA: Diagnosis not present

## 2023-03-11 DIAGNOSIS — D649 Anemia, unspecified: Secondary | ICD-10-CM | POA: Diagnosis not present

## 2023-03-11 DIAGNOSIS — J9601 Acute respiratory failure with hypoxia: Secondary | ICD-10-CM | POA: Diagnosis not present

## 2023-03-11 DIAGNOSIS — M1990 Unspecified osteoarthritis, unspecified site: Secondary | ICD-10-CM | POA: Diagnosis not present

## 2023-03-11 DIAGNOSIS — I10 Essential (primary) hypertension: Secondary | ICD-10-CM | POA: Diagnosis not present

## 2023-03-11 DIAGNOSIS — E876 Hypokalemia: Secondary | ICD-10-CM | POA: Diagnosis not present

## 2023-03-11 DIAGNOSIS — J309 Allergic rhinitis, unspecified: Secondary | ICD-10-CM | POA: Diagnosis not present

## 2023-03-11 DIAGNOSIS — J189 Pneumonia, unspecified organism: Secondary | ICD-10-CM | POA: Diagnosis not present

## 2023-03-13 DIAGNOSIS — J189 Pneumonia, unspecified organism: Secondary | ICD-10-CM | POA: Diagnosis not present

## 2023-03-13 DIAGNOSIS — E876 Hypokalemia: Secondary | ICD-10-CM | POA: Diagnosis not present

## 2023-03-13 DIAGNOSIS — J9601 Acute respiratory failure with hypoxia: Secondary | ICD-10-CM | POA: Diagnosis not present

## 2023-03-13 DIAGNOSIS — J309 Allergic rhinitis, unspecified: Secondary | ICD-10-CM | POA: Diagnosis not present

## 2023-03-13 DIAGNOSIS — I872 Venous insufficiency (chronic) (peripheral): Secondary | ICD-10-CM | POA: Diagnosis not present

## 2023-03-13 DIAGNOSIS — J209 Acute bronchitis, unspecified: Secondary | ICD-10-CM | POA: Diagnosis not present

## 2023-03-14 DIAGNOSIS — J189 Pneumonia, unspecified organism: Secondary | ICD-10-CM | POA: Diagnosis not present

## 2023-03-14 DIAGNOSIS — J9601 Acute respiratory failure with hypoxia: Secondary | ICD-10-CM | POA: Diagnosis not present

## 2023-03-14 DIAGNOSIS — J209 Acute bronchitis, unspecified: Secondary | ICD-10-CM | POA: Diagnosis not present

## 2023-03-14 DIAGNOSIS — I872 Venous insufficiency (chronic) (peripheral): Secondary | ICD-10-CM | POA: Diagnosis not present

## 2023-03-14 DIAGNOSIS — J309 Allergic rhinitis, unspecified: Secondary | ICD-10-CM | POA: Diagnosis not present

## 2023-03-14 DIAGNOSIS — E876 Hypokalemia: Secondary | ICD-10-CM | POA: Diagnosis not present

## 2023-03-16 DIAGNOSIS — I872 Venous insufficiency (chronic) (peripheral): Secondary | ICD-10-CM | POA: Diagnosis not present

## 2023-03-16 DIAGNOSIS — J189 Pneumonia, unspecified organism: Secondary | ICD-10-CM | POA: Diagnosis not present

## 2023-03-16 DIAGNOSIS — J209 Acute bronchitis, unspecified: Secondary | ICD-10-CM | POA: Diagnosis not present

## 2023-03-16 DIAGNOSIS — J309 Allergic rhinitis, unspecified: Secondary | ICD-10-CM | POA: Diagnosis not present

## 2023-03-16 DIAGNOSIS — E876 Hypokalemia: Secondary | ICD-10-CM | POA: Diagnosis not present

## 2023-03-16 DIAGNOSIS — J9601 Acute respiratory failure with hypoxia: Secondary | ICD-10-CM | POA: Diagnosis not present

## 2023-03-21 DIAGNOSIS — J9601 Acute respiratory failure with hypoxia: Secondary | ICD-10-CM | POA: Diagnosis not present

## 2023-03-21 DIAGNOSIS — J309 Allergic rhinitis, unspecified: Secondary | ICD-10-CM | POA: Diagnosis not present

## 2023-03-21 DIAGNOSIS — E876 Hypokalemia: Secondary | ICD-10-CM | POA: Diagnosis not present

## 2023-03-21 DIAGNOSIS — J189 Pneumonia, unspecified organism: Secondary | ICD-10-CM | POA: Diagnosis not present

## 2023-03-21 DIAGNOSIS — J209 Acute bronchitis, unspecified: Secondary | ICD-10-CM | POA: Diagnosis not present

## 2023-03-21 DIAGNOSIS — I872 Venous insufficiency (chronic) (peripheral): Secondary | ICD-10-CM | POA: Diagnosis not present

## 2023-03-22 DIAGNOSIS — J189 Pneumonia, unspecified organism: Secondary | ICD-10-CM | POA: Diagnosis not present

## 2023-03-22 DIAGNOSIS — J309 Allergic rhinitis, unspecified: Secondary | ICD-10-CM | POA: Diagnosis not present

## 2023-03-22 DIAGNOSIS — I872 Venous insufficiency (chronic) (peripheral): Secondary | ICD-10-CM | POA: Diagnosis not present

## 2023-03-22 DIAGNOSIS — J9601 Acute respiratory failure with hypoxia: Secondary | ICD-10-CM | POA: Diagnosis not present

## 2023-03-22 DIAGNOSIS — E876 Hypokalemia: Secondary | ICD-10-CM | POA: Diagnosis not present

## 2023-03-22 DIAGNOSIS — J209 Acute bronchitis, unspecified: Secondary | ICD-10-CM | POA: Diagnosis not present

## 2023-03-23 DIAGNOSIS — J9601 Acute respiratory failure with hypoxia: Secondary | ICD-10-CM | POA: Diagnosis not present

## 2023-03-23 DIAGNOSIS — J309 Allergic rhinitis, unspecified: Secondary | ICD-10-CM | POA: Diagnosis not present

## 2023-03-23 DIAGNOSIS — E876 Hypokalemia: Secondary | ICD-10-CM | POA: Diagnosis not present

## 2023-03-23 DIAGNOSIS — J209 Acute bronchitis, unspecified: Secondary | ICD-10-CM | POA: Diagnosis not present

## 2023-03-23 DIAGNOSIS — I872 Venous insufficiency (chronic) (peripheral): Secondary | ICD-10-CM | POA: Diagnosis not present

## 2023-03-23 DIAGNOSIS — J189 Pneumonia, unspecified organism: Secondary | ICD-10-CM | POA: Diagnosis not present

## 2023-03-28 DIAGNOSIS — E876 Hypokalemia: Secondary | ICD-10-CM | POA: Diagnosis not present

## 2023-03-28 DIAGNOSIS — J309 Allergic rhinitis, unspecified: Secondary | ICD-10-CM | POA: Diagnosis not present

## 2023-03-28 DIAGNOSIS — J189 Pneumonia, unspecified organism: Secondary | ICD-10-CM | POA: Diagnosis not present

## 2023-03-28 DIAGNOSIS — I872 Venous insufficiency (chronic) (peripheral): Secondary | ICD-10-CM | POA: Diagnosis not present

## 2023-03-28 DIAGNOSIS — J9601 Acute respiratory failure with hypoxia: Secondary | ICD-10-CM | POA: Diagnosis not present

## 2023-03-28 DIAGNOSIS — J209 Acute bronchitis, unspecified: Secondary | ICD-10-CM | POA: Diagnosis not present

## 2023-03-30 DIAGNOSIS — J309 Allergic rhinitis, unspecified: Secondary | ICD-10-CM | POA: Diagnosis not present

## 2023-03-30 DIAGNOSIS — J9601 Acute respiratory failure with hypoxia: Secondary | ICD-10-CM | POA: Diagnosis not present

## 2023-03-30 DIAGNOSIS — J209 Acute bronchitis, unspecified: Secondary | ICD-10-CM | POA: Diagnosis not present

## 2023-03-30 DIAGNOSIS — J189 Pneumonia, unspecified organism: Secondary | ICD-10-CM | POA: Diagnosis not present

## 2023-03-30 DIAGNOSIS — E876 Hypokalemia: Secondary | ICD-10-CM | POA: Diagnosis not present

## 2023-03-30 DIAGNOSIS — I872 Venous insufficiency (chronic) (peripheral): Secondary | ICD-10-CM | POA: Diagnosis not present

## 2023-04-03 DIAGNOSIS — J189 Pneumonia, unspecified organism: Secondary | ICD-10-CM | POA: Diagnosis not present

## 2023-04-03 DIAGNOSIS — I872 Venous insufficiency (chronic) (peripheral): Secondary | ICD-10-CM | POA: Diagnosis not present

## 2023-04-03 DIAGNOSIS — J9601 Acute respiratory failure with hypoxia: Secondary | ICD-10-CM | POA: Diagnosis not present

## 2023-04-03 DIAGNOSIS — J209 Acute bronchitis, unspecified: Secondary | ICD-10-CM | POA: Diagnosis not present

## 2023-04-03 DIAGNOSIS — E876 Hypokalemia: Secondary | ICD-10-CM | POA: Diagnosis not present

## 2023-04-03 DIAGNOSIS — J309 Allergic rhinitis, unspecified: Secondary | ICD-10-CM | POA: Diagnosis not present

## 2023-04-06 DIAGNOSIS — J9601 Acute respiratory failure with hypoxia: Secondary | ICD-10-CM | POA: Diagnosis not present

## 2023-04-06 DIAGNOSIS — I872 Venous insufficiency (chronic) (peripheral): Secondary | ICD-10-CM | POA: Diagnosis not present

## 2023-04-06 DIAGNOSIS — J189 Pneumonia, unspecified organism: Secondary | ICD-10-CM | POA: Diagnosis not present

## 2023-04-06 DIAGNOSIS — J309 Allergic rhinitis, unspecified: Secondary | ICD-10-CM | POA: Diagnosis not present

## 2023-04-06 DIAGNOSIS — E876 Hypokalemia: Secondary | ICD-10-CM | POA: Diagnosis not present

## 2023-04-06 DIAGNOSIS — J209 Acute bronchitis, unspecified: Secondary | ICD-10-CM | POA: Diagnosis not present

## 2023-04-07 DIAGNOSIS — I872 Venous insufficiency (chronic) (peripheral): Secondary | ICD-10-CM | POA: Diagnosis not present

## 2023-04-07 DIAGNOSIS — J309 Allergic rhinitis, unspecified: Secondary | ICD-10-CM | POA: Diagnosis not present

## 2023-04-07 DIAGNOSIS — J9601 Acute respiratory failure with hypoxia: Secondary | ICD-10-CM | POA: Diagnosis not present

## 2023-04-07 DIAGNOSIS — J189 Pneumonia, unspecified organism: Secondary | ICD-10-CM | POA: Diagnosis not present

## 2023-04-07 DIAGNOSIS — E876 Hypokalemia: Secondary | ICD-10-CM | POA: Diagnosis not present

## 2023-04-07 DIAGNOSIS — J209 Acute bronchitis, unspecified: Secondary | ICD-10-CM | POA: Diagnosis not present

## 2023-04-10 DIAGNOSIS — I872 Venous insufficiency (chronic) (peripheral): Secondary | ICD-10-CM | POA: Diagnosis not present

## 2023-04-10 DIAGNOSIS — J209 Acute bronchitis, unspecified: Secondary | ICD-10-CM | POA: Diagnosis not present

## 2023-04-10 DIAGNOSIS — E876 Hypokalemia: Secondary | ICD-10-CM | POA: Diagnosis not present

## 2023-04-10 DIAGNOSIS — J309 Allergic rhinitis, unspecified: Secondary | ICD-10-CM | POA: Diagnosis not present

## 2023-04-10 DIAGNOSIS — J9601 Acute respiratory failure with hypoxia: Secondary | ICD-10-CM | POA: Diagnosis not present

## 2023-04-10 DIAGNOSIS — J189 Pneumonia, unspecified organism: Secondary | ICD-10-CM | POA: Diagnosis not present

## 2023-04-11 DIAGNOSIS — J189 Pneumonia, unspecified organism: Secondary | ICD-10-CM | POA: Diagnosis not present

## 2023-04-11 DIAGNOSIS — I872 Venous insufficiency (chronic) (peripheral): Secondary | ICD-10-CM | POA: Diagnosis not present

## 2023-04-11 DIAGNOSIS — J309 Allergic rhinitis, unspecified: Secondary | ICD-10-CM | POA: Diagnosis not present

## 2023-04-11 DIAGNOSIS — M545 Low back pain, unspecified: Secondary | ICD-10-CM | POA: Diagnosis not present

## 2023-04-11 DIAGNOSIS — M5432 Sciatica, left side: Secondary | ICD-10-CM | POA: Diagnosis not present

## 2023-04-11 DIAGNOSIS — M1990 Unspecified osteoarthritis, unspecified site: Secondary | ICD-10-CM | POA: Diagnosis not present

## 2023-04-11 DIAGNOSIS — I10 Essential (primary) hypertension: Secondary | ICD-10-CM | POA: Diagnosis not present

## 2023-04-11 DIAGNOSIS — K219 Gastro-esophageal reflux disease without esophagitis: Secondary | ICD-10-CM | POA: Diagnosis not present

## 2023-04-11 DIAGNOSIS — D649 Anemia, unspecified: Secondary | ICD-10-CM | POA: Diagnosis not present

## 2023-04-11 DIAGNOSIS — E876 Hypokalemia: Secondary | ICD-10-CM | POA: Diagnosis not present

## 2023-04-11 DIAGNOSIS — J9601 Acute respiratory failure with hypoxia: Secondary | ICD-10-CM | POA: Diagnosis not present

## 2023-04-11 DIAGNOSIS — J209 Acute bronchitis, unspecified: Secondary | ICD-10-CM | POA: Diagnosis not present

## 2023-04-13 DIAGNOSIS — J189 Pneumonia, unspecified organism: Secondary | ICD-10-CM | POA: Diagnosis not present

## 2023-04-13 DIAGNOSIS — E876 Hypokalemia: Secondary | ICD-10-CM | POA: Diagnosis not present

## 2023-04-13 DIAGNOSIS — I872 Venous insufficiency (chronic) (peripheral): Secondary | ICD-10-CM | POA: Diagnosis not present

## 2023-04-13 DIAGNOSIS — J309 Allergic rhinitis, unspecified: Secondary | ICD-10-CM | POA: Diagnosis not present

## 2023-04-13 DIAGNOSIS — J9601 Acute respiratory failure with hypoxia: Secondary | ICD-10-CM | POA: Diagnosis not present

## 2023-04-13 DIAGNOSIS — J209 Acute bronchitis, unspecified: Secondary | ICD-10-CM | POA: Diagnosis not present

## 2023-04-17 DIAGNOSIS — J309 Allergic rhinitis, unspecified: Secondary | ICD-10-CM | POA: Diagnosis not present

## 2023-04-17 DIAGNOSIS — I872 Venous insufficiency (chronic) (peripheral): Secondary | ICD-10-CM | POA: Diagnosis not present

## 2023-04-17 DIAGNOSIS — E876 Hypokalemia: Secondary | ICD-10-CM | POA: Diagnosis not present

## 2023-04-17 DIAGNOSIS — J209 Acute bronchitis, unspecified: Secondary | ICD-10-CM | POA: Diagnosis not present

## 2023-04-17 DIAGNOSIS — J9601 Acute respiratory failure with hypoxia: Secondary | ICD-10-CM | POA: Diagnosis not present

## 2023-04-17 DIAGNOSIS — J189 Pneumonia, unspecified organism: Secondary | ICD-10-CM | POA: Diagnosis not present

## 2023-04-18 DIAGNOSIS — I872 Venous insufficiency (chronic) (peripheral): Secondary | ICD-10-CM | POA: Diagnosis not present

## 2023-04-18 DIAGNOSIS — J189 Pneumonia, unspecified organism: Secondary | ICD-10-CM | POA: Diagnosis not present

## 2023-04-18 DIAGNOSIS — J209 Acute bronchitis, unspecified: Secondary | ICD-10-CM | POA: Diagnosis not present

## 2023-04-18 DIAGNOSIS — J309 Allergic rhinitis, unspecified: Secondary | ICD-10-CM | POA: Diagnosis not present

## 2023-04-18 DIAGNOSIS — J9601 Acute respiratory failure with hypoxia: Secondary | ICD-10-CM | POA: Diagnosis not present

## 2023-04-18 DIAGNOSIS — E876 Hypokalemia: Secondary | ICD-10-CM | POA: Diagnosis not present

## 2023-04-20 DIAGNOSIS — E876 Hypokalemia: Secondary | ICD-10-CM | POA: Diagnosis not present

## 2023-04-20 DIAGNOSIS — J9601 Acute respiratory failure with hypoxia: Secondary | ICD-10-CM | POA: Diagnosis not present

## 2023-04-20 DIAGNOSIS — J209 Acute bronchitis, unspecified: Secondary | ICD-10-CM | POA: Diagnosis not present

## 2023-04-20 DIAGNOSIS — J189 Pneumonia, unspecified organism: Secondary | ICD-10-CM | POA: Diagnosis not present

## 2023-04-20 DIAGNOSIS — I872 Venous insufficiency (chronic) (peripheral): Secondary | ICD-10-CM | POA: Diagnosis not present

## 2023-04-20 DIAGNOSIS — J309 Allergic rhinitis, unspecified: Secondary | ICD-10-CM | POA: Diagnosis not present

## 2023-04-22 DIAGNOSIS — J209 Acute bronchitis, unspecified: Secondary | ICD-10-CM | POA: Diagnosis not present

## 2023-04-22 DIAGNOSIS — I872 Venous insufficiency (chronic) (peripheral): Secondary | ICD-10-CM | POA: Diagnosis not present

## 2023-04-22 DIAGNOSIS — J309 Allergic rhinitis, unspecified: Secondary | ICD-10-CM | POA: Diagnosis not present

## 2023-04-22 DIAGNOSIS — E876 Hypokalemia: Secondary | ICD-10-CM | POA: Diagnosis not present

## 2023-04-22 DIAGNOSIS — J189 Pneumonia, unspecified organism: Secondary | ICD-10-CM | POA: Diagnosis not present

## 2023-04-22 DIAGNOSIS — J9601 Acute respiratory failure with hypoxia: Secondary | ICD-10-CM | POA: Diagnosis not present

## 2023-04-24 DIAGNOSIS — E876 Hypokalemia: Secondary | ICD-10-CM | POA: Diagnosis not present

## 2023-04-24 DIAGNOSIS — I872 Venous insufficiency (chronic) (peripheral): Secondary | ICD-10-CM | POA: Diagnosis not present

## 2023-04-24 DIAGNOSIS — J209 Acute bronchitis, unspecified: Secondary | ICD-10-CM | POA: Diagnosis not present

## 2023-04-24 DIAGNOSIS — J309 Allergic rhinitis, unspecified: Secondary | ICD-10-CM | POA: Diagnosis not present

## 2023-04-24 DIAGNOSIS — J9601 Acute respiratory failure with hypoxia: Secondary | ICD-10-CM | POA: Diagnosis not present

## 2023-04-24 DIAGNOSIS — J189 Pneumonia, unspecified organism: Secondary | ICD-10-CM | POA: Diagnosis not present

## 2023-04-25 DIAGNOSIS — I872 Venous insufficiency (chronic) (peripheral): Secondary | ICD-10-CM | POA: Diagnosis not present

## 2023-04-25 DIAGNOSIS — J189 Pneumonia, unspecified organism: Secondary | ICD-10-CM | POA: Diagnosis not present

## 2023-04-25 DIAGNOSIS — J309 Allergic rhinitis, unspecified: Secondary | ICD-10-CM | POA: Diagnosis not present

## 2023-04-25 DIAGNOSIS — J9601 Acute respiratory failure with hypoxia: Secondary | ICD-10-CM | POA: Diagnosis not present

## 2023-04-25 DIAGNOSIS — J209 Acute bronchitis, unspecified: Secondary | ICD-10-CM | POA: Diagnosis not present

## 2023-04-25 DIAGNOSIS — E876 Hypokalemia: Secondary | ICD-10-CM | POA: Diagnosis not present

## 2023-04-26 ENCOUNTER — Ambulatory Visit: Payer: Medicare Other | Admitting: Internal Medicine

## 2023-04-27 DIAGNOSIS — J309 Allergic rhinitis, unspecified: Secondary | ICD-10-CM | POA: Diagnosis not present

## 2023-04-27 DIAGNOSIS — I872 Venous insufficiency (chronic) (peripheral): Secondary | ICD-10-CM | POA: Diagnosis not present

## 2023-04-27 DIAGNOSIS — J209 Acute bronchitis, unspecified: Secondary | ICD-10-CM | POA: Diagnosis not present

## 2023-04-27 DIAGNOSIS — J9601 Acute respiratory failure with hypoxia: Secondary | ICD-10-CM | POA: Diagnosis not present

## 2023-04-27 DIAGNOSIS — J189 Pneumonia, unspecified organism: Secondary | ICD-10-CM | POA: Diagnosis not present

## 2023-04-27 DIAGNOSIS — E876 Hypokalemia: Secondary | ICD-10-CM | POA: Diagnosis not present

## 2023-05-01 DIAGNOSIS — J309 Allergic rhinitis, unspecified: Secondary | ICD-10-CM | POA: Diagnosis not present

## 2023-05-01 DIAGNOSIS — J9601 Acute respiratory failure with hypoxia: Secondary | ICD-10-CM | POA: Diagnosis not present

## 2023-05-01 DIAGNOSIS — I872 Venous insufficiency (chronic) (peripheral): Secondary | ICD-10-CM | POA: Diagnosis not present

## 2023-05-01 DIAGNOSIS — J189 Pneumonia, unspecified organism: Secondary | ICD-10-CM | POA: Diagnosis not present

## 2023-05-01 DIAGNOSIS — J209 Acute bronchitis, unspecified: Secondary | ICD-10-CM | POA: Diagnosis not present

## 2023-05-01 DIAGNOSIS — E876 Hypokalemia: Secondary | ICD-10-CM | POA: Diagnosis not present

## 2023-05-02 DIAGNOSIS — J9601 Acute respiratory failure with hypoxia: Secondary | ICD-10-CM | POA: Diagnosis not present

## 2023-05-02 DIAGNOSIS — J189 Pneumonia, unspecified organism: Secondary | ICD-10-CM | POA: Diagnosis not present

## 2023-05-02 DIAGNOSIS — J209 Acute bronchitis, unspecified: Secondary | ICD-10-CM | POA: Diagnosis not present

## 2023-05-02 DIAGNOSIS — E876 Hypokalemia: Secondary | ICD-10-CM | POA: Diagnosis not present

## 2023-05-02 DIAGNOSIS — I872 Venous insufficiency (chronic) (peripheral): Secondary | ICD-10-CM | POA: Diagnosis not present

## 2023-05-02 DIAGNOSIS — J309 Allergic rhinitis, unspecified: Secondary | ICD-10-CM | POA: Diagnosis not present

## 2023-05-04 DIAGNOSIS — J9601 Acute respiratory failure with hypoxia: Secondary | ICD-10-CM | POA: Diagnosis not present

## 2023-05-04 DIAGNOSIS — I872 Venous insufficiency (chronic) (peripheral): Secondary | ICD-10-CM | POA: Diagnosis not present

## 2023-05-04 DIAGNOSIS — J309 Allergic rhinitis, unspecified: Secondary | ICD-10-CM | POA: Diagnosis not present

## 2023-05-04 DIAGNOSIS — E876 Hypokalemia: Secondary | ICD-10-CM | POA: Diagnosis not present

## 2023-05-04 DIAGNOSIS — J209 Acute bronchitis, unspecified: Secondary | ICD-10-CM | POA: Diagnosis not present

## 2023-05-04 DIAGNOSIS — J189 Pneumonia, unspecified organism: Secondary | ICD-10-CM | POA: Diagnosis not present

## 2023-05-07 DIAGNOSIS — J9601 Acute respiratory failure with hypoxia: Secondary | ICD-10-CM | POA: Diagnosis not present

## 2023-05-07 DIAGNOSIS — E876 Hypokalemia: Secondary | ICD-10-CM | POA: Diagnosis not present

## 2023-05-07 DIAGNOSIS — J309 Allergic rhinitis, unspecified: Secondary | ICD-10-CM | POA: Diagnosis not present

## 2023-05-07 DIAGNOSIS — I872 Venous insufficiency (chronic) (peripheral): Secondary | ICD-10-CM | POA: Diagnosis not present

## 2023-05-07 DIAGNOSIS — J189 Pneumonia, unspecified organism: Secondary | ICD-10-CM | POA: Diagnosis not present

## 2023-05-07 DIAGNOSIS — J209 Acute bronchitis, unspecified: Secondary | ICD-10-CM | POA: Diagnosis not present

## 2023-05-08 DIAGNOSIS — J309 Allergic rhinitis, unspecified: Secondary | ICD-10-CM | POA: Diagnosis not present

## 2023-05-08 DIAGNOSIS — J209 Acute bronchitis, unspecified: Secondary | ICD-10-CM | POA: Diagnosis not present

## 2023-05-08 DIAGNOSIS — J9601 Acute respiratory failure with hypoxia: Secondary | ICD-10-CM | POA: Diagnosis not present

## 2023-05-08 DIAGNOSIS — I872 Venous insufficiency (chronic) (peripheral): Secondary | ICD-10-CM | POA: Diagnosis not present

## 2023-05-08 DIAGNOSIS — J189 Pneumonia, unspecified organism: Secondary | ICD-10-CM | POA: Diagnosis not present

## 2023-05-08 DIAGNOSIS — E876 Hypokalemia: Secondary | ICD-10-CM | POA: Diagnosis not present

## 2023-05-09 DIAGNOSIS — J189 Pneumonia, unspecified organism: Secondary | ICD-10-CM | POA: Diagnosis not present

## 2023-05-09 DIAGNOSIS — J309 Allergic rhinitis, unspecified: Secondary | ICD-10-CM | POA: Diagnosis not present

## 2023-05-09 DIAGNOSIS — J209 Acute bronchitis, unspecified: Secondary | ICD-10-CM | POA: Diagnosis not present

## 2023-05-09 DIAGNOSIS — E876 Hypokalemia: Secondary | ICD-10-CM | POA: Diagnosis not present

## 2023-05-09 DIAGNOSIS — I872 Venous insufficiency (chronic) (peripheral): Secondary | ICD-10-CM | POA: Diagnosis not present

## 2023-05-09 DIAGNOSIS — J9601 Acute respiratory failure with hypoxia: Secondary | ICD-10-CM | POA: Diagnosis not present

## 2023-05-10 DIAGNOSIS — E876 Hypokalemia: Secondary | ICD-10-CM | POA: Diagnosis not present

## 2023-05-10 DIAGNOSIS — I872 Venous insufficiency (chronic) (peripheral): Secondary | ICD-10-CM | POA: Diagnosis not present

## 2023-05-10 DIAGNOSIS — J309 Allergic rhinitis, unspecified: Secondary | ICD-10-CM | POA: Diagnosis not present

## 2023-05-10 DIAGNOSIS — J189 Pneumonia, unspecified organism: Secondary | ICD-10-CM | POA: Diagnosis not present

## 2023-05-10 DIAGNOSIS — J9601 Acute respiratory failure with hypoxia: Secondary | ICD-10-CM | POA: Diagnosis not present

## 2023-05-10 DIAGNOSIS — J209 Acute bronchitis, unspecified: Secondary | ICD-10-CM | POA: Diagnosis not present

## 2023-05-11 DIAGNOSIS — E876 Hypokalemia: Secondary | ICD-10-CM | POA: Diagnosis not present

## 2023-05-11 DIAGNOSIS — I872 Venous insufficiency (chronic) (peripheral): Secondary | ICD-10-CM | POA: Diagnosis not present

## 2023-05-11 DIAGNOSIS — J209 Acute bronchitis, unspecified: Secondary | ICD-10-CM | POA: Diagnosis not present

## 2023-05-11 DIAGNOSIS — J189 Pneumonia, unspecified organism: Secondary | ICD-10-CM | POA: Diagnosis not present

## 2023-05-11 DIAGNOSIS — J309 Allergic rhinitis, unspecified: Secondary | ICD-10-CM | POA: Diagnosis not present

## 2023-05-11 DIAGNOSIS — J9601 Acute respiratory failure with hypoxia: Secondary | ICD-10-CM | POA: Diagnosis not present

## 2023-05-12 DIAGNOSIS — I872 Venous insufficiency (chronic) (peripheral): Secondary | ICD-10-CM | POA: Diagnosis not present

## 2023-05-12 DIAGNOSIS — M545 Low back pain, unspecified: Secondary | ICD-10-CM | POA: Diagnosis not present

## 2023-05-12 DIAGNOSIS — M1990 Unspecified osteoarthritis, unspecified site: Secondary | ICD-10-CM | POA: Diagnosis not present

## 2023-05-12 DIAGNOSIS — E876 Hypokalemia: Secondary | ICD-10-CM | POA: Diagnosis not present

## 2023-05-12 DIAGNOSIS — J309 Allergic rhinitis, unspecified: Secondary | ICD-10-CM | POA: Diagnosis not present

## 2023-05-12 DIAGNOSIS — D649 Anemia, unspecified: Secondary | ICD-10-CM | POA: Diagnosis not present

## 2023-05-12 DIAGNOSIS — J189 Pneumonia, unspecified organism: Secondary | ICD-10-CM | POA: Diagnosis not present

## 2023-05-12 DIAGNOSIS — M5432 Sciatica, left side: Secondary | ICD-10-CM | POA: Diagnosis not present

## 2023-05-12 DIAGNOSIS — J9601 Acute respiratory failure with hypoxia: Secondary | ICD-10-CM | POA: Diagnosis not present

## 2023-05-12 DIAGNOSIS — I10 Essential (primary) hypertension: Secondary | ICD-10-CM | POA: Diagnosis not present

## 2023-05-12 DIAGNOSIS — K219 Gastro-esophageal reflux disease without esophagitis: Secondary | ICD-10-CM | POA: Diagnosis not present

## 2023-05-12 DIAGNOSIS — J209 Acute bronchitis, unspecified: Secondary | ICD-10-CM | POA: Diagnosis not present

## 2023-05-15 DIAGNOSIS — I872 Venous insufficiency (chronic) (peripheral): Secondary | ICD-10-CM | POA: Diagnosis not present

## 2023-05-15 DIAGNOSIS — J209 Acute bronchitis, unspecified: Secondary | ICD-10-CM | POA: Diagnosis not present

## 2023-05-15 DIAGNOSIS — J309 Allergic rhinitis, unspecified: Secondary | ICD-10-CM | POA: Diagnosis not present

## 2023-05-15 DIAGNOSIS — J9601 Acute respiratory failure with hypoxia: Secondary | ICD-10-CM | POA: Diagnosis not present

## 2023-05-15 DIAGNOSIS — E876 Hypokalemia: Secondary | ICD-10-CM | POA: Diagnosis not present

## 2023-05-15 DIAGNOSIS — J189 Pneumonia, unspecified organism: Secondary | ICD-10-CM | POA: Diagnosis not present

## 2023-05-16 DIAGNOSIS — J309 Allergic rhinitis, unspecified: Secondary | ICD-10-CM | POA: Diagnosis not present

## 2023-05-16 DIAGNOSIS — E876 Hypokalemia: Secondary | ICD-10-CM | POA: Diagnosis not present

## 2023-05-16 DIAGNOSIS — J209 Acute bronchitis, unspecified: Secondary | ICD-10-CM | POA: Diagnosis not present

## 2023-05-16 DIAGNOSIS — J9601 Acute respiratory failure with hypoxia: Secondary | ICD-10-CM | POA: Diagnosis not present

## 2023-05-16 DIAGNOSIS — I872 Venous insufficiency (chronic) (peripheral): Secondary | ICD-10-CM | POA: Diagnosis not present

## 2023-05-16 DIAGNOSIS — J189 Pneumonia, unspecified organism: Secondary | ICD-10-CM | POA: Diagnosis not present

## 2023-05-18 DIAGNOSIS — J309 Allergic rhinitis, unspecified: Secondary | ICD-10-CM | POA: Diagnosis not present

## 2023-05-18 DIAGNOSIS — J9601 Acute respiratory failure with hypoxia: Secondary | ICD-10-CM | POA: Diagnosis not present

## 2023-05-18 DIAGNOSIS — J209 Acute bronchitis, unspecified: Secondary | ICD-10-CM | POA: Diagnosis not present

## 2023-05-18 DIAGNOSIS — I872 Venous insufficiency (chronic) (peripheral): Secondary | ICD-10-CM | POA: Diagnosis not present

## 2023-05-18 DIAGNOSIS — E876 Hypokalemia: Secondary | ICD-10-CM | POA: Diagnosis not present

## 2023-05-18 DIAGNOSIS — J189 Pneumonia, unspecified organism: Secondary | ICD-10-CM | POA: Diagnosis not present

## 2023-05-20 DIAGNOSIS — J209 Acute bronchitis, unspecified: Secondary | ICD-10-CM | POA: Diagnosis not present

## 2023-05-20 DIAGNOSIS — J309 Allergic rhinitis, unspecified: Secondary | ICD-10-CM | POA: Diagnosis not present

## 2023-05-20 DIAGNOSIS — J9601 Acute respiratory failure with hypoxia: Secondary | ICD-10-CM | POA: Diagnosis not present

## 2023-05-20 DIAGNOSIS — I872 Venous insufficiency (chronic) (peripheral): Secondary | ICD-10-CM | POA: Diagnosis not present

## 2023-05-20 DIAGNOSIS — J189 Pneumonia, unspecified organism: Secondary | ICD-10-CM | POA: Diagnosis not present

## 2023-05-20 DIAGNOSIS — E876 Hypokalemia: Secondary | ICD-10-CM | POA: Diagnosis not present

## 2023-05-22 DIAGNOSIS — I872 Venous insufficiency (chronic) (peripheral): Secondary | ICD-10-CM | POA: Diagnosis not present

## 2023-05-22 DIAGNOSIS — J209 Acute bronchitis, unspecified: Secondary | ICD-10-CM | POA: Diagnosis not present

## 2023-05-22 DIAGNOSIS — J189 Pneumonia, unspecified organism: Secondary | ICD-10-CM | POA: Diagnosis not present

## 2023-05-22 DIAGNOSIS — E876 Hypokalemia: Secondary | ICD-10-CM | POA: Diagnosis not present

## 2023-05-22 DIAGNOSIS — J309 Allergic rhinitis, unspecified: Secondary | ICD-10-CM | POA: Diagnosis not present

## 2023-05-22 DIAGNOSIS — J9601 Acute respiratory failure with hypoxia: Secondary | ICD-10-CM | POA: Diagnosis not present

## 2023-05-23 DIAGNOSIS — E876 Hypokalemia: Secondary | ICD-10-CM | POA: Diagnosis not present

## 2023-05-23 DIAGNOSIS — J209 Acute bronchitis, unspecified: Secondary | ICD-10-CM | POA: Diagnosis not present

## 2023-05-23 DIAGNOSIS — J189 Pneumonia, unspecified organism: Secondary | ICD-10-CM | POA: Diagnosis not present

## 2023-05-23 DIAGNOSIS — I872 Venous insufficiency (chronic) (peripheral): Secondary | ICD-10-CM | POA: Diagnosis not present

## 2023-05-23 DIAGNOSIS — J309 Allergic rhinitis, unspecified: Secondary | ICD-10-CM | POA: Diagnosis not present

## 2023-05-23 DIAGNOSIS — J9601 Acute respiratory failure with hypoxia: Secondary | ICD-10-CM | POA: Diagnosis not present

## 2023-05-25 DIAGNOSIS — J209 Acute bronchitis, unspecified: Secondary | ICD-10-CM | POA: Diagnosis not present

## 2023-05-25 DIAGNOSIS — E876 Hypokalemia: Secondary | ICD-10-CM | POA: Diagnosis not present

## 2023-05-25 DIAGNOSIS — I872 Venous insufficiency (chronic) (peripheral): Secondary | ICD-10-CM | POA: Diagnosis not present

## 2023-05-25 DIAGNOSIS — J189 Pneumonia, unspecified organism: Secondary | ICD-10-CM | POA: Diagnosis not present

## 2023-05-25 DIAGNOSIS — J309 Allergic rhinitis, unspecified: Secondary | ICD-10-CM | POA: Diagnosis not present

## 2023-05-25 DIAGNOSIS — J9601 Acute respiratory failure with hypoxia: Secondary | ICD-10-CM | POA: Diagnosis not present

## 2023-05-29 DIAGNOSIS — J9601 Acute respiratory failure with hypoxia: Secondary | ICD-10-CM | POA: Diagnosis not present

## 2023-05-29 DIAGNOSIS — J189 Pneumonia, unspecified organism: Secondary | ICD-10-CM | POA: Diagnosis not present

## 2023-05-29 DIAGNOSIS — J209 Acute bronchitis, unspecified: Secondary | ICD-10-CM | POA: Diagnosis not present

## 2023-05-29 DIAGNOSIS — J309 Allergic rhinitis, unspecified: Secondary | ICD-10-CM | POA: Diagnosis not present

## 2023-05-29 DIAGNOSIS — I872 Venous insufficiency (chronic) (peripheral): Secondary | ICD-10-CM | POA: Diagnosis not present

## 2023-05-29 DIAGNOSIS — E876 Hypokalemia: Secondary | ICD-10-CM | POA: Diagnosis not present

## 2023-05-30 DIAGNOSIS — I872 Venous insufficiency (chronic) (peripheral): Secondary | ICD-10-CM | POA: Diagnosis not present

## 2023-05-30 DIAGNOSIS — J189 Pneumonia, unspecified organism: Secondary | ICD-10-CM | POA: Diagnosis not present

## 2023-05-30 DIAGNOSIS — J309 Allergic rhinitis, unspecified: Secondary | ICD-10-CM | POA: Diagnosis not present

## 2023-05-30 DIAGNOSIS — J209 Acute bronchitis, unspecified: Secondary | ICD-10-CM | POA: Diagnosis not present

## 2023-05-30 DIAGNOSIS — J9601 Acute respiratory failure with hypoxia: Secondary | ICD-10-CM | POA: Diagnosis not present

## 2023-05-30 DIAGNOSIS — E876 Hypokalemia: Secondary | ICD-10-CM | POA: Diagnosis not present

## 2023-06-01 DIAGNOSIS — E876 Hypokalemia: Secondary | ICD-10-CM | POA: Diagnosis not present

## 2023-06-01 DIAGNOSIS — J9601 Acute respiratory failure with hypoxia: Secondary | ICD-10-CM | POA: Diagnosis not present

## 2023-06-01 DIAGNOSIS — J309 Allergic rhinitis, unspecified: Secondary | ICD-10-CM | POA: Diagnosis not present

## 2023-06-01 DIAGNOSIS — J209 Acute bronchitis, unspecified: Secondary | ICD-10-CM | POA: Diagnosis not present

## 2023-06-01 DIAGNOSIS — J189 Pneumonia, unspecified organism: Secondary | ICD-10-CM | POA: Diagnosis not present

## 2023-06-01 DIAGNOSIS — I872 Venous insufficiency (chronic) (peripheral): Secondary | ICD-10-CM | POA: Diagnosis not present

## 2023-06-04 DIAGNOSIS — J309 Allergic rhinitis, unspecified: Secondary | ICD-10-CM | POA: Diagnosis not present

## 2023-06-04 DIAGNOSIS — I872 Venous insufficiency (chronic) (peripheral): Secondary | ICD-10-CM | POA: Diagnosis not present

## 2023-06-04 DIAGNOSIS — J189 Pneumonia, unspecified organism: Secondary | ICD-10-CM | POA: Diagnosis not present

## 2023-06-04 DIAGNOSIS — E876 Hypokalemia: Secondary | ICD-10-CM | POA: Diagnosis not present

## 2023-06-04 DIAGNOSIS — J209 Acute bronchitis, unspecified: Secondary | ICD-10-CM | POA: Diagnosis not present

## 2023-06-04 DIAGNOSIS — J9601 Acute respiratory failure with hypoxia: Secondary | ICD-10-CM | POA: Diagnosis not present

## 2023-06-05 ENCOUNTER — Ambulatory Visit: Payer: Medicare Other | Admitting: Internal Medicine

## 2023-06-05 VITALS — BP 120/62

## 2023-06-05 DIAGNOSIS — E876 Hypokalemia: Secondary | ICD-10-CM | POA: Diagnosis not present

## 2023-06-05 DIAGNOSIS — L02215 Cutaneous abscess of perineum: Secondary | ICD-10-CM | POA: Diagnosis not present

## 2023-06-05 DIAGNOSIS — J209 Acute bronchitis, unspecified: Secondary | ICD-10-CM | POA: Diagnosis not present

## 2023-06-05 DIAGNOSIS — J9601 Acute respiratory failure with hypoxia: Secondary | ICD-10-CM | POA: Diagnosis not present

## 2023-06-05 DIAGNOSIS — J189 Pneumonia, unspecified organism: Secondary | ICD-10-CM | POA: Diagnosis not present

## 2023-06-05 DIAGNOSIS — I872 Venous insufficiency (chronic) (peripheral): Secondary | ICD-10-CM | POA: Diagnosis not present

## 2023-06-05 DIAGNOSIS — J309 Allergic rhinitis, unspecified: Secondary | ICD-10-CM | POA: Diagnosis not present

## 2023-06-05 MED ORDER — CEPHALEXIN 500 MG PO CAPS: 500.0000 mg | ORAL_CAPSULE | Freq: Four times a day (QID) | ORAL | 0 refills | Status: AC

## 2023-06-05 MED ORDER — METRONIDAZOLE 500 MG PO TABS: 500.0000 mg | ORAL_TABLET | Freq: Three times a day (TID) | ORAL | 0 refills | Status: AC

## 2023-06-06 DIAGNOSIS — J209 Acute bronchitis, unspecified: Secondary | ICD-10-CM | POA: Diagnosis not present

## 2023-06-06 DIAGNOSIS — J189 Pneumonia, unspecified organism: Secondary | ICD-10-CM | POA: Diagnosis not present

## 2023-06-06 DIAGNOSIS — E876 Hypokalemia: Secondary | ICD-10-CM | POA: Diagnosis not present

## 2023-06-06 DIAGNOSIS — I872 Venous insufficiency (chronic) (peripheral): Secondary | ICD-10-CM | POA: Diagnosis not present

## 2023-06-06 DIAGNOSIS — L02215 Cutaneous abscess of perineum: Secondary | ICD-10-CM | POA: Insufficient documentation

## 2023-06-06 DIAGNOSIS — J309 Allergic rhinitis, unspecified: Secondary | ICD-10-CM | POA: Diagnosis not present

## 2023-06-06 DIAGNOSIS — J9601 Acute respiratory failure with hypoxia: Secondary | ICD-10-CM | POA: Diagnosis not present

## 2023-06-06 NOTE — Progress Notes (Signed)
   Acute Office Visit  Subjective:     Patient ID: [name], female    DOB: [birth year], 89 y.o.   MRN: [id number]  Chief Complaint  Patient presents with   Open wound innner thigh/ backside    Office visit    HPI   [name] is 89 years old female who is under hospice care and was seen in her home on the request of family because of painful rash in her private area.  Patient says that it hurt to touch.  She need assistance in mobility and has a 24 hour caregiver with her.  Patient has a tender erythematous area in her left side of for labia majora.  No drainage and no discharge.  Review of Systems  Constitutional: Negative.   Genitourinary:          Large boil in her left labia majora with surrounding erythema and tender to touch.  There is a small fluctuation in the middle of that area.        Objective:    BP 120/62 (BP Location: Left Arm, Patient Position: Supine)    Physical Exam Constitutional:      Appearance: Normal appearance.  Genitourinary:    Comments:   Large boil on left side of her labia majora, with surrounding erythema and fluctuation in the middle.  It is tender to touch. Neurological:     Mental Status: She is alert.     No results found for any visits on 06/05/23.      Assessment & Plan:   Problem List Items Addressed This Visit       Other   Perineal abscess - Primary   I will start her on cephalexin 500 mg every 6 hours and flagyl 500 mg three time a day for 7 days. If not better then she may need I&D       Meds ordered this encounter  Medications   metroNIDAZOLE (FLAGYL) 500 MG tablet    Sig: Take 1 tablet (500 mg total) by mouth 3 (three) times daily for 7 days.    Dispense:  21 tablet    Refill:  0   cephALEXin (KEFLEX) 500 MG capsule    Sig: Take 1 capsule (500 mg total) by mouth 4 (four) times daily for 7 days.    Dispense:  28 capsule    Refill:  0    No follow-ups on file.  [name], MD

## 2023-06-06 NOTE — Assessment & Plan Note (Signed)
 I will start her on cephalexin 500 mg every 6 hours and flagyl 500 mg three time a day for 7 days. If not better then she may need I&D

## 2023-06-08 DIAGNOSIS — J209 Acute bronchitis, unspecified: Secondary | ICD-10-CM | POA: Diagnosis not present

## 2023-06-08 DIAGNOSIS — I872 Venous insufficiency (chronic) (peripheral): Secondary | ICD-10-CM | POA: Diagnosis not present

## 2023-06-08 DIAGNOSIS — J9601 Acute respiratory failure with hypoxia: Secondary | ICD-10-CM | POA: Diagnosis not present

## 2023-06-08 DIAGNOSIS — J309 Allergic rhinitis, unspecified: Secondary | ICD-10-CM | POA: Diagnosis not present

## 2023-06-08 DIAGNOSIS — J189 Pneumonia, unspecified organism: Secondary | ICD-10-CM | POA: Diagnosis not present

## 2023-06-08 DIAGNOSIS — E876 Hypokalemia: Secondary | ICD-10-CM | POA: Diagnosis not present

## 2023-06-09 DIAGNOSIS — K219 Gastro-esophageal reflux disease without esophagitis: Secondary | ICD-10-CM | POA: Diagnosis not present

## 2023-06-09 DIAGNOSIS — I872 Venous insufficiency (chronic) (peripheral): Secondary | ICD-10-CM | POA: Diagnosis not present

## 2023-06-09 DIAGNOSIS — J9601 Acute respiratory failure with hypoxia: Secondary | ICD-10-CM | POA: Diagnosis not present

## 2023-06-09 DIAGNOSIS — D649 Anemia, unspecified: Secondary | ICD-10-CM | POA: Diagnosis not present

## 2023-06-09 DIAGNOSIS — J309 Allergic rhinitis, unspecified: Secondary | ICD-10-CM | POA: Diagnosis not present

## 2023-06-09 DIAGNOSIS — J189 Pneumonia, unspecified organism: Secondary | ICD-10-CM | POA: Diagnosis not present

## 2023-06-09 DIAGNOSIS — M5432 Sciatica, left side: Secondary | ICD-10-CM | POA: Diagnosis not present

## 2023-06-09 DIAGNOSIS — E876 Hypokalemia: Secondary | ICD-10-CM | POA: Diagnosis not present

## 2023-06-09 DIAGNOSIS — M1990 Unspecified osteoarthritis, unspecified site: Secondary | ICD-10-CM | POA: Diagnosis not present

## 2023-06-09 DIAGNOSIS — M545 Low back pain, unspecified: Secondary | ICD-10-CM | POA: Diagnosis not present

## 2023-06-09 DIAGNOSIS — J209 Acute bronchitis, unspecified: Secondary | ICD-10-CM | POA: Diagnosis not present

## 2023-06-09 DIAGNOSIS — I10 Essential (primary) hypertension: Secondary | ICD-10-CM | POA: Diagnosis not present

## 2023-06-12 DIAGNOSIS — I872 Venous insufficiency (chronic) (peripheral): Secondary | ICD-10-CM | POA: Diagnosis not present

## 2023-06-12 DIAGNOSIS — J9601 Acute respiratory failure with hypoxia: Secondary | ICD-10-CM | POA: Diagnosis not present

## 2023-06-12 DIAGNOSIS — J189 Pneumonia, unspecified organism: Secondary | ICD-10-CM | POA: Diagnosis not present

## 2023-06-12 DIAGNOSIS — J309 Allergic rhinitis, unspecified: Secondary | ICD-10-CM | POA: Diagnosis not present

## 2023-06-12 DIAGNOSIS — E876 Hypokalemia: Secondary | ICD-10-CM | POA: Diagnosis not present

## 2023-06-12 DIAGNOSIS — J209 Acute bronchitis, unspecified: Secondary | ICD-10-CM | POA: Diagnosis not present

## 2023-06-13 DIAGNOSIS — J209 Acute bronchitis, unspecified: Secondary | ICD-10-CM | POA: Diagnosis not present

## 2023-06-13 DIAGNOSIS — J189 Pneumonia, unspecified organism: Secondary | ICD-10-CM | POA: Diagnosis not present

## 2023-06-13 DIAGNOSIS — J9601 Acute respiratory failure with hypoxia: Secondary | ICD-10-CM | POA: Diagnosis not present

## 2023-06-13 DIAGNOSIS — I872 Venous insufficiency (chronic) (peripheral): Secondary | ICD-10-CM | POA: Diagnosis not present

## 2023-06-13 DIAGNOSIS — E876 Hypokalemia: Secondary | ICD-10-CM | POA: Diagnosis not present

## 2023-06-13 DIAGNOSIS — J309 Allergic rhinitis, unspecified: Secondary | ICD-10-CM | POA: Diagnosis not present

## 2023-06-15 DIAGNOSIS — J209 Acute bronchitis, unspecified: Secondary | ICD-10-CM | POA: Diagnosis not present

## 2023-06-15 DIAGNOSIS — I872 Venous insufficiency (chronic) (peripheral): Secondary | ICD-10-CM | POA: Diagnosis not present

## 2023-06-15 DIAGNOSIS — J189 Pneumonia, unspecified organism: Secondary | ICD-10-CM | POA: Diagnosis not present

## 2023-06-15 DIAGNOSIS — J9601 Acute respiratory failure with hypoxia: Secondary | ICD-10-CM | POA: Diagnosis not present

## 2023-06-15 DIAGNOSIS — E876 Hypokalemia: Secondary | ICD-10-CM | POA: Diagnosis not present

## 2023-06-15 DIAGNOSIS — J309 Allergic rhinitis, unspecified: Secondary | ICD-10-CM | POA: Diagnosis not present

## 2023-06-18 DIAGNOSIS — J9601 Acute respiratory failure with hypoxia: Secondary | ICD-10-CM | POA: Diagnosis not present

## 2023-06-18 DIAGNOSIS — J209 Acute bronchitis, unspecified: Secondary | ICD-10-CM | POA: Diagnosis not present

## 2023-06-18 DIAGNOSIS — J189 Pneumonia, unspecified organism: Secondary | ICD-10-CM | POA: Diagnosis not present

## 2023-06-18 DIAGNOSIS — J309 Allergic rhinitis, unspecified: Secondary | ICD-10-CM | POA: Diagnosis not present

## 2023-06-18 DIAGNOSIS — E876 Hypokalemia: Secondary | ICD-10-CM | POA: Diagnosis not present

## 2023-06-18 DIAGNOSIS — I872 Venous insufficiency (chronic) (peripheral): Secondary | ICD-10-CM | POA: Diagnosis not present

## 2023-06-19 DIAGNOSIS — J189 Pneumonia, unspecified organism: Secondary | ICD-10-CM | POA: Diagnosis not present

## 2023-06-19 DIAGNOSIS — J309 Allergic rhinitis, unspecified: Secondary | ICD-10-CM | POA: Diagnosis not present

## 2023-06-19 DIAGNOSIS — I872 Venous insufficiency (chronic) (peripheral): Secondary | ICD-10-CM | POA: Diagnosis not present

## 2023-06-19 DIAGNOSIS — J9601 Acute respiratory failure with hypoxia: Secondary | ICD-10-CM | POA: Diagnosis not present

## 2023-06-19 DIAGNOSIS — E876 Hypokalemia: Secondary | ICD-10-CM | POA: Diagnosis not present

## 2023-06-19 DIAGNOSIS — J209 Acute bronchitis, unspecified: Secondary | ICD-10-CM | POA: Diagnosis not present

## 2023-06-20 DIAGNOSIS — I872 Venous insufficiency (chronic) (peripheral): Secondary | ICD-10-CM | POA: Diagnosis not present

## 2023-06-20 DIAGNOSIS — J189 Pneumonia, unspecified organism: Secondary | ICD-10-CM | POA: Diagnosis not present

## 2023-06-20 DIAGNOSIS — E876 Hypokalemia: Secondary | ICD-10-CM | POA: Diagnosis not present

## 2023-06-20 DIAGNOSIS — J309 Allergic rhinitis, unspecified: Secondary | ICD-10-CM | POA: Diagnosis not present

## 2023-06-20 DIAGNOSIS — J9601 Acute respiratory failure with hypoxia: Secondary | ICD-10-CM | POA: Diagnosis not present

## 2023-06-20 DIAGNOSIS — J209 Acute bronchitis, unspecified: Secondary | ICD-10-CM | POA: Diagnosis not present

## 2023-06-22 DIAGNOSIS — E876 Hypokalemia: Secondary | ICD-10-CM | POA: Diagnosis not present

## 2023-06-22 DIAGNOSIS — I872 Venous insufficiency (chronic) (peripheral): Secondary | ICD-10-CM | POA: Diagnosis not present

## 2023-06-22 DIAGNOSIS — R0682 Tachypnea, not elsewhere classified: Secondary | ICD-10-CM | POA: Diagnosis not present

## 2023-06-22 DIAGNOSIS — J189 Pneumonia, unspecified organism: Secondary | ICD-10-CM | POA: Diagnosis not present

## 2023-06-22 DIAGNOSIS — J9601 Acute respiratory failure with hypoxia: Secondary | ICD-10-CM | POA: Diagnosis not present

## 2023-06-22 DIAGNOSIS — J209 Acute bronchitis, unspecified: Secondary | ICD-10-CM | POA: Diagnosis not present

## 2023-06-22 DIAGNOSIS — R330 Drug induced retention of urine: Secondary | ICD-10-CM | POA: Diagnosis not present

## 2023-06-22 DIAGNOSIS — J309 Allergic rhinitis, unspecified: Secondary | ICD-10-CM | POA: Diagnosis not present

## 2023-07-10 DEATH — deceased
